# Patient Record
Sex: Female | Born: 1960 | Race: White | Hispanic: No | Marital: Single | Smoking: Former smoker
Health system: Southern US, Community
[De-identification: ages and names within clinical notes are randomized; demographics above are authoritative.]

## PROBLEM LIST (undated history)

## (undated) DIAGNOSIS — F418 Other specified anxiety disorders: Secondary | ICD-10-CM

## (undated) DIAGNOSIS — R011 Cardiac murmur, unspecified: Secondary | ICD-10-CM

## (undated) DIAGNOSIS — Z8616 Personal history of COVID-19: Secondary | ICD-10-CM

## (undated) DIAGNOSIS — R519 Headache, unspecified: Secondary | ICD-10-CM

## (undated) DIAGNOSIS — R51 Headache: Secondary | ICD-10-CM

## (undated) DIAGNOSIS — I10 Essential (primary) hypertension: Secondary | ICD-10-CM

## (undated) DIAGNOSIS — R7303 Prediabetes: Secondary | ICD-10-CM

## (undated) DIAGNOSIS — E785 Hyperlipidemia, unspecified: Secondary | ICD-10-CM

## (undated) DIAGNOSIS — Z8679 Personal history of other diseases of the circulatory system: Secondary | ICD-10-CM

## (undated) HISTORY — DX: Hyperlipidemia, unspecified: E78.5

## (undated) HISTORY — PX: TONSILLECTOMY: SUR1361

## (undated) HISTORY — PX: ANKLE FRACTURE SURGERY: SHX122

## (undated) HISTORY — DX: Other specified anxiety disorders: F41.8

## (undated) HISTORY — DX: Essential (primary) hypertension: I10

## (undated) HISTORY — DX: Cardiac murmur, unspecified: R01.1

## (undated) HISTORY — DX: Personal history of other diseases of the circulatory system: Z86.79

## (undated) HISTORY — DX: Headache, unspecified: R51.9

## (undated) HISTORY — DX: Prediabetes: R73.03

## (undated) HISTORY — DX: Personal history of COVID-19: Z86.16

## (undated) HISTORY — DX: Headache: R51

---

## 2016-09-17 DIAGNOSIS — Z23 Encounter for immunization: Secondary | ICD-10-CM | POA: Diagnosis not present

## 2017-01-11 DIAGNOSIS — Z6837 Body mass index (BMI) 37.0-37.9, adult: Secondary | ICD-10-CM | POA: Diagnosis not present

## 2017-01-11 DIAGNOSIS — I10 Essential (primary) hypertension: Secondary | ICD-10-CM | POA: Diagnosis not present

## 2017-01-11 DIAGNOSIS — F418 Other specified anxiety disorders: Secondary | ICD-10-CM | POA: Diagnosis not present

## 2017-02-07 DIAGNOSIS — Z0000 Encounter for general adult medical examination without abnormal findings: Secondary | ICD-10-CM | POA: Diagnosis not present

## 2017-02-09 DIAGNOSIS — Z0000 Encounter for general adult medical examination without abnormal findings: Secondary | ICD-10-CM | POA: Diagnosis not present

## 2017-02-09 DIAGNOSIS — Z124 Encounter for screening for malignant neoplasm of cervix: Secondary | ICD-10-CM | POA: Diagnosis not present

## 2017-02-09 DIAGNOSIS — R8271 Bacteriuria: Secondary | ICD-10-CM | POA: Diagnosis not present

## 2017-02-09 DIAGNOSIS — Z1231 Encounter for screening mammogram for malignant neoplasm of breast: Secondary | ICD-10-CM | POA: Diagnosis not present

## 2017-04-19 DIAGNOSIS — J209 Acute bronchitis, unspecified: Secondary | ICD-10-CM | POA: Diagnosis not present

## 2017-05-05 DIAGNOSIS — Z6835 Body mass index (BMI) 35.0-35.9, adult: Secondary | ICD-10-CM | POA: Diagnosis not present

## 2017-05-05 DIAGNOSIS — G4482 Headache associated with sexual activity: Secondary | ICD-10-CM | POA: Diagnosis not present

## 2017-05-09 DIAGNOSIS — Z6834 Body mass index (BMI) 34.0-34.9, adult: Secondary | ICD-10-CM | POA: Diagnosis not present

## 2017-05-09 DIAGNOSIS — G4482 Headache associated with sexual activity: Secondary | ICD-10-CM | POA: Diagnosis not present

## 2017-05-09 DIAGNOSIS — I10 Essential (primary) hypertension: Secondary | ICD-10-CM | POA: Diagnosis not present

## 2017-05-09 DIAGNOSIS — E669 Obesity, unspecified: Secondary | ICD-10-CM | POA: Diagnosis not present

## 2017-05-15 DIAGNOSIS — G4482 Headache associated with sexual activity: Secondary | ICD-10-CM | POA: Diagnosis not present

## 2017-05-15 DIAGNOSIS — R51 Headache: Secondary | ICD-10-CM | POA: Diagnosis not present

## 2017-05-23 ENCOUNTER — Encounter: Payer: Self-pay | Admitting: Neurology

## 2017-05-23 ENCOUNTER — Encounter (INDEPENDENT_AMBULATORY_CARE_PROVIDER_SITE_OTHER): Payer: Self-pay

## 2017-05-23 ENCOUNTER — Ambulatory Visit (INDEPENDENT_AMBULATORY_CARE_PROVIDER_SITE_OTHER): Payer: BLUE CROSS/BLUE SHIELD | Admitting: Neurology

## 2017-05-23 DIAGNOSIS — G43709 Chronic migraine without aura, not intractable, without status migrainosus: Secondary | ICD-10-CM | POA: Diagnosis not present

## 2017-05-23 DIAGNOSIS — IMO0002 Reserved for concepts with insufficient information to code with codable children: Secondary | ICD-10-CM | POA: Insufficient documentation

## 2017-05-23 DIAGNOSIS — H4922 Sixth [abducent] nerve palsy, left eye: Secondary | ICD-10-CM

## 2017-05-23 NOTE — Progress Notes (Signed)
PATIENT: Dana Hamilton DOB: [birth year]  Chief Complaint  Patient presents with  . Headache    Reports history of migraines that resolved after menopause.  However, she developed a severe headache on 05/05/17 during sexual activity.  Since this time, she has continue to have frequent headaches.  She estimates having three headaches per week.  She has been using Tylenol which has been helpful for her pain.  She would like to discuss the results of her recent MRI and MRA.  Marland Kitchen PCP    Bartholome Bill, MD     HISTORICAL  Dana Hamilton is a 56 years old left-handed female, seen in refer by  her primary care doctor Bartholome Bill for evaluation of headaches, MRI MRA report, initial evaluation was on May 23 2017.  She had a history of hypertension, depression anxiety, has been taking Wellbutrin 300 mg daily,  She had a history of migraine headache in her forties, improved after menopause, Maxalt as needed used to be very helpful, her typical migraine are lateralized severe pounding headache with associated light noise sensitivity, nauseous, lasting for a few hours,  She has not had migraine for few years, on May 06 2015, she had a sudden onset severe exploding headache during sex activity, lasting for 5 minutes, followed by low degree pressure headache next day, since then, she experienced feel more similar exertion related headache, such as bending over, sudden positional change, short lasting, no significant light noise sensitivity, more to her occipital region,  For that reason, she was referred for MRI of the brain, MRA of the brain in June 2018, we have personally reviewed the report in detail, normal MRI of the brain, there was description of mild irregularity at A2 segments bilaterally, there was no evidence of aneurysm   REVIEW OF SYSTEMS: Full 14 system review of systems performed and notable only for anxiety, not enough sleep, headache,   ALLERGIES: Not on File  HOME  MEDICATIONS: Current Outpatient Prescriptions  Medication Sig Dispense Refill  . buPROPion (WELLBUTRIN XL) 300 MG 24 hr tablet Take 300 mg by mouth daily.    . Cholecalciferol (VITAMIN D3) 2000 units capsule Take 2,000 Units by mouth daily.    Marland Kitchen lisinopril-hydrochlorothiazide (PRINZIDE,ZESTORETIC) 10-12.5 MG tablet Take 1 tablet by mouth daily.  2  . Multiple Vitamin (MULTIVITAMIN) capsule Take 1 capsule by mouth daily.     No current facility-administered medications for this visit.     PAST MEDICAL HISTORY: Past Medical History:  Diagnosis Date  . Depression with anxiety   . Headache   . Hypertension     PAST SURGICAL HISTORY: Past Surgical History:  Procedure Laterality Date  . ANKLE FRACTURE SURGERY Right   . TONSILLECTOMY      FAMILY HISTORY: Family History  Problem Relation Age of Onset  . Atrial fibrillation Mother   . Memory loss Mother   . Parkinson's disease Father   . Heart disease Maternal Grandfather   . Colon cancer Paternal Grandmother   . Transient ischemic attack Paternal Grandfather   . Stroke Paternal Uncle     SOCIAL HISTORY:  Social History   Social History  . Marital status: Single    Spouse name: N/A  . Number of children: 1  . Years of education: MBA   Occupational History  . Publicist    Social History Main Topics  . Smoking status: Former Smoker    Types: Cigarettes    Quit date: 11/01/1991  . Smokeless tobacco: Never  Used  . Alcohol use Yes     Comment: 1-2 glasses of wine per week  . Drug use: No     Comment: no use since 2010  . Sexual activity: Not on file   Other Topics Concern  . Not on file   Social History Narrative   Lives at home with her daughter.   Left-handed.   2 cups caffeine per day.     PHYSICAL EXAM   Vitals:   05/23/17 1448  BP: (!) 158/97  Pulse: 82  Weight: 223 lb (101.2 kg)  Height: 5\' 8"  (1.727 m)    Not recorded      Body mass index is 33.91 kg/m.  PHYSICAL EXAMNIATION:  Gen: NAD,  conversant, well nourised, obese, well groomed                     Cardiovascular: Regular rate rhythm, no peripheral edema, warm, nontender. Eyes: Conjunctivae clear without exudates or hemorrhage Neck: Supple, no carotid bruits. Pulmonary: Clear to auscultation bilaterally   NEUROLOGICAL EXAM:  MENTAL STATUS: Speech:    Speech is normal; fluent and spontaneous with normal comprehension.  Cognition:     Orientation to time, place and person     Normal recent and remote memory     Normal Attention span and concentration     Normal Language, naming, repeating,spontaneous speech     Fund of knowledge   CRANIAL NERVES: CN II: Visual fields are full to confrontation. Fundoscopic exam is normal with sharp discs and no vascular changes. Pupils are round equal and briskly reactive to light. CN III, IV, VI:  No ptosis. Left lateral gaze palsy CN V: Facial sensation is intact to pinprick in all 3 divisions bilaterally. Corneal responses are intact.  CN VII: Face is symmetric with normal eye closure and smile. CN VIII: Hearing is normal to rubbing fingers CN IX, X: Palate elevates symmetrically. Phonation is normal. CN XI: Head turning and shoulder shrug are intact CN XII: Tongue is midline with normal movements and no atrophy.  MOTOR: There is no pronator drift of out-stretched arms. Muscle bulk and tone are normal. Muscle strength is normal.  REFLEXES: Reflexes are 2+ and symmetric at the biceps, triceps, knees, and ankles. Plantar responses are flexor.  SENSORY: Intact to light touch, pinprick, positional sensation and vibratory sensation are intact in fingers and toes.  COORDINATION: Rapid alternating movements and fine finger movements are intact. There is no dysmetria on finger-to-nose and heel-knee-shin.    GAIT/STANCE: Posture is normal. Gait is steady with normal steps, base, arm swing, and turning. Heel and toe walking are normal. Tandem gait is normal.  Romberg is  absent.   DIAGNOSTIC DATA (LABS, IMAGING, TESTING) - I reviewed patient records, labs, notes, testing and imaging myself where available.   ASSESSMENT AND PLAN  Dana Hamilton is a 56 y.o. 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   Marcial Pacas, M.D. Ph.D.  [facility] [address] [place],  [address] Ph: [phone] Fax: [phone]  [id number]:OZYY, Dola Factor, MD

## 2017-05-24 DIAGNOSIS — H4920 Sixth [abducent] nerve palsy, unspecified eye: Secondary | ICD-10-CM | POA: Insufficient documentation

## 2017-06-09 ENCOUNTER — Ambulatory Visit: Payer: BLUE CROSS/BLUE SHIELD | Admitting: Diagnostic Neuroimaging

## 2017-08-03 ENCOUNTER — Observation Stay (HOSPITAL_COMMUNITY)
Admission: EM | Admit: 2017-08-03 | Discharge: 2017-08-04 | Disposition: A | Discharge status: Home / Self Care | Payer: BLUE CROSS/BLUE SHIELD | Attending: Internal Medicine | Admitting: Internal Medicine

## 2017-08-03 ENCOUNTER — Emergency Department (HOSPITAL_COMMUNITY): Payer: BLUE CROSS/BLUE SHIELD

## 2017-08-03 ENCOUNTER — Encounter (HOSPITAL_COMMUNITY): Payer: Self-pay | Admitting: Emergency Medicine

## 2017-08-03 DIAGNOSIS — Z87891 Personal history of nicotine dependence: Secondary | ICD-10-CM | POA: Diagnosis not present

## 2017-08-03 DIAGNOSIS — R079 Chest pain, unspecified: Secondary | ICD-10-CM | POA: Diagnosis present

## 2017-08-03 DIAGNOSIS — R000 Tachycardia, unspecified: Secondary | ICD-10-CM | POA: Diagnosis not present

## 2017-08-03 DIAGNOSIS — R0789 Other chest pain: Secondary | ICD-10-CM | POA: Insufficient documentation

## 2017-08-03 DIAGNOSIS — I700 Atherosclerosis of aorta: Principal | ICD-10-CM | POA: Insufficient documentation

## 2017-08-03 DIAGNOSIS — R9431 Abnormal electrocardiogram [ECG] [EKG]: Secondary | ICD-10-CM | POA: Diagnosis not present

## 2017-08-03 DIAGNOSIS — D72829 Elevated white blood cell count, unspecified: Secondary | ICD-10-CM | POA: Diagnosis not present

## 2017-08-03 DIAGNOSIS — R739 Hyperglycemia, unspecified: Secondary | ICD-10-CM

## 2017-08-03 DIAGNOSIS — Z79899 Other long term (current) drug therapy: Secondary | ICD-10-CM | POA: Diagnosis not present

## 2017-08-03 DIAGNOSIS — R7989 Other specified abnormal findings of blood chemistry: Secondary | ICD-10-CM | POA: Diagnosis not present

## 2017-08-03 DIAGNOSIS — R072 Precordial pain: Secondary | ICD-10-CM

## 2017-08-03 DIAGNOSIS — I10 Essential (primary) hypertension: Secondary | ICD-10-CM | POA: Insufficient documentation

## 2017-08-03 LAB — CBC
HCT: 42.8 % (ref 36.0–46.0)
Hemoglobin: 14.2 g/dL (ref 12.0–15.0)
MCH: 27.9 pg (ref 26.0–34.0)
MCHC: 33.2 g/dL (ref 30.0–36.0)
MCV: 84.1 fL (ref 78.0–100.0)
PLATELETS: 316 10*3/uL (ref 150–400)
RBC: 5.09 MIL/uL (ref 3.87–5.11)
RDW: 13.9 % (ref 11.5–15.5)
WBC: 18 10*3/uL — AB (ref 4.0–10.5)

## 2017-08-03 LAB — BASIC METABOLIC PANEL
Anion gap: 9 (ref 5–15)
BUN: 19 mg/dL (ref 6–20)
CO2: 26 mmol/L (ref 22–32)
CREATININE: 1.17 mg/dL — AB (ref 0.44–1.00)
Calcium: 9.3 mg/dL (ref 8.9–10.3)
Chloride: 100 mmol/L — ABNORMAL LOW (ref 101–111)
GFR calc Af Amer: 58 mL/min — ABNORMAL LOW (ref >60)
GFR, EST NON AFRICAN AMERICAN: 50 mL/min — AB (ref >60)
GLUCOSE: 109 mg/dL — AB (ref 65–99)
Potassium: 4.5 mmol/L (ref 3.5–5.1)
SODIUM: 135 mmol/L (ref 135–145)

## 2017-08-03 LAB — HEMOGLOBIN A1C
HEMOGLOBIN A1C: 5.5 % (ref 4.8–5.6)
MEAN PLASMA GLUCOSE: 111.15 mg/dL

## 2017-08-03 LAB — I-STAT TROPONIN, ED: Troponin i, poc: 0.01 ng/mL (ref 0.00–0.08)

## 2017-08-03 LAB — TROPONIN I: Troponin I: <0.03 ng/mL (ref <0.03)

## 2017-08-03 LAB — TSH: TSH: 0.62 u[IU]/mL (ref 0.350–4.500)

## 2017-08-03 LAB — D-DIMER, QUANTITATIVE (NOT AT ARMC): D DIMER QUANT: 0.8 ug{FEU}/mL — AB (ref 0.00–0.50)

## 2017-08-03 MED ORDER — CARVEDILOL 6.25 MG PO TABS
6.2500 mg | ORAL_TABLET | Freq: Two times a day (BID) | ORAL | Status: DC
  Filled 2017-08-03 (×2): qty 1

## 2017-08-03 MED ORDER — ACETAMINOPHEN 325 MG PO TABS: 650.0000 mg | ORAL_TABLET | Freq: Four times a day (QID) | ORAL | Status: DC | PRN

## 2017-08-03 MED ORDER — ENOXAPARIN SODIUM 40 MG/0.4ML ~~LOC~~ SOLN
40.0000 mg | Freq: Every day | SUBCUTANEOUS | Status: DC
  Administered 2017-08-03: 40 mg via SUBCUTANEOUS
  Filled 2017-08-03: qty 0.4

## 2017-08-03 MED ORDER — ACETAMINOPHEN 650 MG RE SUPP: 650.0000 mg | Freq: Four times a day (QID) | RECTAL | Status: DC | PRN

## 2017-08-03 MED ORDER — KETOROLAC TROMETHAMINE 30 MG/ML IJ SOLN
30.0000 mg | Freq: Once | INTRAMUSCULAR | Status: AC
  Administered 2017-08-03: 30 mg via INTRAVENOUS
  Filled 2017-08-03: qty 1

## 2017-08-03 MED ORDER — ATORVASTATIN CALCIUM 80 MG PO TABS: 80.0000 mg | ORAL_TABLET | Freq: Every day | ORAL | Status: DC

## 2017-08-03 MED ORDER — IOPAMIDOL (ISOVUE-370) INJECTION 76%
INTRAVENOUS | Status: AC
  Administered 2017-08-03: 100 mL
  Filled 2017-08-03: qty 100

## 2017-08-03 MED ORDER — ASPIRIN EC 81 MG PO TBEC
81.0000 mg | DELAYED_RELEASE_TABLET | Freq: Every day | ORAL | Status: DC
  Administered 2017-08-03 – 2017-08-04 (×2): 81 mg via ORAL
  Filled 2017-08-03 (×2): qty 1

## 2017-08-03 MED ORDER — BUPROPION HCL ER (XL) 150 MG PO TB24
300.0000 mg | ORAL_TABLET | Freq: Every day | ORAL | Status: DC
  Administered 2017-08-03 – 2017-08-04 (×2): 300 mg via ORAL
  Filled 2017-08-03 (×2): qty 2

## 2017-08-03 NOTE — ED Notes (Signed)
Pt placed on hospital bed at this time.  

## 2017-08-03 NOTE — ED Notes (Signed)
ED Provider at bedside. 

## 2017-08-03 NOTE — ED Notes (Signed)
Cardiology MD at bedside.

## 2017-08-03 NOTE — ED Notes (Signed)
Pt called multiple times without response

## 2017-08-03 NOTE — ED Notes (Signed)
Pt up to restroom with steady gait, denies dizziness or lightheadedness

## 2017-08-03 NOTE — ED Notes (Signed)
Cardiologist at bedside.  

## 2017-08-03 NOTE — H&P (Signed)
TRH H&P   Patient Demographics:    Dana Hamilton, is a 56 y.o. female  MRN: [id number]   DOB - [birth year]  Admit Date - 08/03/2017  Outpatient Primary MD for the patient is Bartholome Bill, MD  Referring MD/NP/PA:   Farley Ly  Outpatient Specialists:  Bonnielee Haff (neurology)  Patient coming from: home=> urgent care=> pcp  Chief Complaint  Patient presents with  . Chest Pain      HPI:    Dana Hamilton  is a 56 y.o. female, w hypertension, apparently presents with c/o chest pain 11:30 am  At rest while working on the computer, substernal, 6/10 , moderate , heavy and pressure like.  Radiation to the left shoulder and right shoulder and left arm. Pt went to urgent care to be evaluated, and then went to see pcp and sent to ED.  Pt was given slg, nitro without relief.  .   In ED, trop negative.  Wbc 18.0, Hgb 14.2, Plt 316, Bun 19, Creatinine 1.17,  Glucose 109,  Ekg, see below,  ? St elevation in 2,3.  Pt was seen by cardiology and no STEMI CTA chest => negative for PE, no pneumonia, mild coronary artery calcifications. Pt is  1/10 chest pain at this time.    Pt will be admitted for w/up of chest pain .     Review of systems:    In addition to the HPI above,  T. Chol 185, Tg 100, HDl 42, LDL 123 on 02/07/2017  No Fever-chills, No Headache, No changes with Vision or hearing, No problems swallowing food or Liquids, Cough or Shortness of Breath, No Abdominal pain, No Nausea or Vommitting, Bowel movements are regular, No Blood in stool or Urine, No dysuria, No new skin rashes or bruises, No new joints pains-aches,  No new weakness, tingling, numbness in any extremity, No recent weight gain or loss, No polyuria, polydypsia or polyphagia, No significant Mental Stressors.  A full 10 point Review of Systems was done, except as stated above, all other Review of  Systems were negative.   With Past History of the following :    Past Medical History:  Diagnosis Date  . Depression with anxiety   . Headache   . Hypertension       Past Surgical History:  Procedure Laterality Date  . ANKLE FRACTURE SURGERY Right   . TONSILLECTOMY        Social History:     Social History  Substance Use Topics  . Smoking status: Former Smoker    Types: Cigarettes    Quit date: 11/01/1991  . Smokeless tobacco: Never Used  . Alcohol use Yes     Comment: 1-2 glasses of wine per week     Lives - at home  Mobility - walks by self   Family History :     Family History  Problem Relation Age of Onset  . Atrial fibrillation Mother   . Memory loss Mother   . Parkinson's disease Father   . Heart disease Maternal Grandfather   . Colon cancer Paternal Grandmother   . Transient ischemic attack Paternal Grandfather   . Stroke Paternal Uncle      Home Medications:   Prior to Admission medications   Medication Sig Start Date End Date Taking? Authorizing Provider  acetaminophen (TYLENOL) 325 MG tablet Take 650 mg by mouth every 6 (six) hours as needed (for pain or headaches).    Yes [provider]  buPROPion (WELLBUTRIN XL) 300 MG 24 hr tablet Take 300 mg by mouth daily. 01/11/17  Yes [provider]  Cholecalciferol (VITAMIN D3) 2000 units capsule Take 2,000 Units by mouth daily.   Yes [provider]  ibuprofen (ADVIL,MOTRIN) 200 MG tablet Take 400 mg by mouth every 6 (six) hours as needed (for pain or headaches).   Yes [provider]  lisinopril-hydrochlorothiazide (PRINZIDE,ZESTORETIC) 10-12.5 MG tablet Take 1 tablet by mouth daily. 04/08/17  Yes [provider]  Multiple Vitamins-Calcium (ONE-A-DAY WOMENS FORMULA PO) Take 1 tablet by mouth daily.   Yes [provider]     Allergies:    No Known Allergies   Physical Exam:   Vitals  Blood pressure 129/90, pulse 100, temperature 99.2 F (37.3  C), temperature source Oral, resp. rate 17, height 5\' 6"  (1.676 m), weight 99.8 kg (220 lb), SpO2 100 %.   1. General  lying in bed in NAD,    2. Normal affect and insight, Not Suicidal or Homicidal, Awake Alert, Oriented X 3.  3. No F.N deficits, ALL C.Nerves Intact, Strength 5/5 all 4 extremities, Sensation intact all 4 extremities, Plantars down going.  4. Ears and Eyes appear Normal, Conjunctivae clear, PERRLA. Moist Oral Mucosa.  5. Supple Neck, No JVD, No cervical lymphadenopathy appriciated, No Carotid Bruits.  6. Symmetrical Chest wall movement, Good air movement bilaterally, CTAB.  7. RRR, No Gallops, Rubs or Murmurs, No Parasternal Heave.  8. Positive Bowel Sounds, Abdomen Soft, No tenderness, No organomegaly appriciated,No rebound -guarding or rigidity.  9.  No Cyanosis, Normal Skin Turgor, No Skin Rash or Bruise.  10. Good muscle tone,  joints appear normal , no effusions, Normal ROM.  11. No Palpable Lymph Nodes in Neck or Axillae     Data Review:    CBC  Recent Labs Lab 08/03/17 1612  WBC 18.0*  HGB 14.2  HCT 42.8  PLT 316  MCV 84.1  MCH 27.9  MCHC 33.2  RDW 13.9   ------------------------------------------------------------------------------------------------------------------  Chemistries   Recent Labs Lab 08/03/17 1612  NA 135  K 4.5  CL 100*  CO2 26  GLUCOSE 109*  BUN 19  CREATININE 1.17*  CALCIUM 9.3   ------------------------------------------------------------------------------------------------------------------ estimated creatinine clearance is 62.5 mL/min (A) (by C-G formula based on SCr of 1.17 mg/dL (H)). ------------------------------------------------------------------------------------------------------------------ No results for input(s): TSH, T4TOTAL, T3FREE, THYROIDAB in the last 72 hours.  Invalid input(s): FREET3  Coagulation profile No results for input(s): INR, PROTIME in the last 168  hours. -------------------------------------------------------------------------------------------------------------------  Recent Labs  08/03/17 1612  DDIMER 0.80*   -------------------------------------------------------------------------------------------------------------------  Cardiac Enzymes No results for input(s): CKMB, TROPONINI, MYOGLOBIN in the last 168 hours.  Invalid input(s): CK ------------------------------------------------------------------------------------------------------------------ No results found for: BNP   ---------------------------------------------------------------------------------------------------------------  Urinalysis No results found for: COLORURINE, APPEARANCEUR, [place], [place], GLUCOSEU, HGBUR, BILIRUBINUR, KETONESUR, PROTEINUR, UROBILINOGEN, NITRITE, LEUKOCYTESUR  ----------------------------------------------------------------------------------------------------------------   Imaging Results:  Dg Chest 2 View  Result Date: 08/03/2017 CLINICAL DATA:  Mid chest pain. Abnormal EKG no respiratory distress. History of hypertension, former smoker. EXAM: CHEST  2 VIEW COMPARISON:  None in PACs FINDINGS: The lungs are borderline hypoinflated but clear. The heart and pulmonary vascularity are normal. The mediastinum is normal in width. There is no pleural effusion. The bony thorax exhibits no acute abnormality. IMPRESSION: There is no CHF nor other acute cardiopulmonary abnormality. Electronically Signed   By: David  Martinique M.D.   On: 08/03/2017 16:54   Ct Angio Chest Pe W And/or Wo Contrast  Result Date: 08/03/2017 CLINICAL DATA:  Chest pain shortness of breath.  Positive D-dimer. EXAM: CT ANGIOGRAPHY CHEST WITH CONTRAST TECHNIQUE: Multidetector CT imaging of the chest was performed using the standard protocol during bolus administration of intravenous contrast. Multiplanar CT image reconstructions and MIPs were obtained to evaluate the vascular  anatomy. CONTRAST:  100 mL of Isovue 370 intravenous contrast COMPARISON:  Current chest radiograph. FINDINGS: Cardiovascular: Satisfactory opacification of the pulmonary arteries to the segmental level. No evidence of pulmonary embolism. Heart is normal in size. No pericardial effusion. Mild coronary artery calcifications. Great vessels are normal in caliber. No aortic dissection. No atherosclerosis. Mediastinum/Nodes: No enlarged mediastinal, hilar, or axillary lymph nodes. Thyroid gland, trachea, and esophagus demonstrate no significant findings. Lungs/Pleura: No focal lung consolidation. Mild areas of relative increased decreased attenuation consistent mosaic profusion. This is likely on the basis of small airways disease. No pulmonary edema. No lung mass or suspicious nodule. No pleural effusion or pneumothorax. Upper Abdomen: No acute abnormality. Musculoskeletal: No fracture or acute finding. No osteoblastic or osteolytic lesions. Mild disc degenerative changes noted throughout the thoracic spine Review of the MIP images confirms the above findings. IMPRESSION: 1. No evidence of a pulmonary embolism. 2. Mild mosaic profusion evident in the lungs most likely due to small airways disease. 3. No evidence of pneumonia.  No pulmonary edema. Electronically Signed   By: Lajean Manes M.D.   On: 08/03/2017 19:02   Nsr at 80 nl axis, nl int, early R progression, ? Hint of ST elevation in 2   Assessment & Plan:    Principal Problem:   Chest pain Active Problems:   Leukocytosis   Tachycardia   Hyperglycemia    Cp Tele Trop I q3h x3 Aspirin, lipitor NPO after Mn Cardiac echo Cardiology consulted, appreciate input.  Defer to cardiology regarding stress testing.   Tachycardia, ? Anxiety,  Check tsh Check cardiac echo as above  Leukocytosis ? Stress Repeat cbc in am  Hyperglycemia Check hga1c,   Mild renal insufficiency Check cmp in am   DVT Prophylaxis   Lovenox - SCDs  AM Labs  Ordered, also please review Full Orders  Family Communication: Admission, patients condition and plan of care including tests being ordered have been discussed with the patient  who indicate understanding and agree with the plan and Code Status.  Code Status  FULL CODE  Likely DC to  home  Condition GUARDED    Consults called:   Cardiology   Admission status: obs   Time spent in minutes : 45    Jani Gravel M.D on 08/03/2017 at 8:01 PM  Between 7am to 7pm - Pager - [phone]. After 7pm go to [email or web address] - password [facility]  [facility] - Office  [phone]

## 2017-08-03 NOTE — ED Provider Notes (Signed)
[facility] DEPT Provider Note   CSN: [id number] Arrival date & time: 08/03/17  1537     History   Chief Complaint Chief Complaint  Patient presents with  . Chest Pain    HPI Dana Hamilton is a 56 y.o. female.  The history is provided by the patient and medical records. No language interpreter was used.  Chest Pain   This is a new problem. The current episode started 6 to 12 hours ago. The problem occurs constantly. The problem has not changed since onset.The pain is present in the substernal region. The pain is at a severity of 6/10. The pain is moderate. The quality of the pain is described as heavy, pressure-like and pleuritic. The pain radiates to the left shoulder, right shoulder and left arm. Duration of episode(s) is 5 hours. Associated symptoms include diaphoresis, nausea and shortness of breath. Pertinent negatives include no abdominal pain, no back pain, no cough, no fever, no headaches, no irregular heartbeat, no lower extremity edema, no malaise/fatigue, no numbness, no palpitations, no sputum production, no syncope, no vomiting and no weakness. She has tried nitroglycerin for the symptoms. The treatment provided moderate relief. Risk factors include stress.  Her past medical history is significant for hypertension.    Past Medical History:  Diagnosis Date  . Depression with anxiety   . Headache   . Hypertension     Patient Active Problem List   Diagnosis Date Noted  . 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 Chronic migraine 05/23/2017    Past Surgical History:  Procedure Laterality Date  . ANKLE FRACTURE SURGERY Right   . TONSILLECTOMY      OB History    No data available       Home Medications    Prior to Admission medications   Medication Sig Start Date End Date Taking? Authorizing Provider  buPROPion (WELLBUTRIN XL) 300 MG 24 hr tablet Take 300 mg by mouth daily. 01/11/17   [provider]  Cholecalciferol (VITAMIN D3) 2000  units capsule Take 2,000 Units by mouth daily.    [provider]  lisinopril-hydrochlorothiazide (PRINZIDE,ZESTORETIC) 10-12.5 MG tablet Take 1 tablet by mouth daily. 04/08/17   [provider]    Family History Family History  Problem Relation Age of Onset  . Atrial fibrillation Mother   . Memory loss Mother   . Parkinson's disease Father   . Heart disease Maternal Grandfather   . Colon cancer Paternal Grandmother   . Transient ischemic attack Paternal Grandfather   . Stroke Paternal Uncle     Social History Social History  Substance Use Topics  . Smoking status: Former Smoker    Types: Cigarettes    Quit date: 11/01/1991  . Smokeless tobacco: Never Used  . Alcohol use Yes     Comment: 1-2 glasses of wine per week     Allergies   Patient has no known allergies.   Review of Systems Review of Systems  Constitutional: Positive for diaphoresis. Negative for chills, fatigue, fever and malaise/fatigue.  HENT: Negative for congestion.   Eyes: Negative for visual disturbance.  Respiratory: Positive for shortness of breath. Negative for cough, sputum production, choking, chest tightness, wheezing and stridor.   Cardiovascular: Positive for chest pain. Negative for palpitations, leg swelling and syncope.  Gastrointestinal: Positive for nausea. Negative for abdominal pain, constipation, diarrhea and vomiting.  Genitourinary: Negative for dysuria.  Musculoskeletal: Negative for back pain, neck pain and neck stiffness.  Skin: Negative for rash and wound.  Neurological:  Negative for weakness, light-headedness, numbness and headaches.  Psychiatric/Behavioral: Negative for agitation and confusion.  All other systems reviewed and are negative.    Physical Exam Updated Vital Signs BP (!) 149/119 (BP Location: Left Arm)   Pulse 98   Temp 99.2 F (37.3 C) (Oral)   Resp (!) 23   Ht 5\' 6"  (1.676 m)   Wt 99.8 kg (220 lb)   SpO2 96%   BMI 35.51 kg/m   Physical  Exam  Constitutional: She is oriented to person, place, and time. She appears well-developed and well-nourished. No distress.  HENT:  Head: Normocephalic and atraumatic.  Mouth/Throat: Oropharynx is clear and moist. No oropharyngeal exudate.  Eyes: Pupils are equal, round, and reactive to light. Conjunctivae and EOM are normal.  Neck: Normal range of motion. Neck supple.  Cardiovascular: Normal rate and intact distal pulses.   No murmur heard. Pulmonary/Chest: Effort normal and breath sounds normal. No stridor. No respiratory distress. She has no wheezes. She has no rales. She exhibits no tenderness.  Abdominal: Soft. There is no tenderness.  Musculoskeletal: She exhibits no edema or tenderness.  Neurological: She is alert and oriented to person, place, and time. No sensory deficit. She exhibits normal muscle tone.  Skin: Skin is warm and dry. Capillary refill takes less than 2 seconds. No rash noted. She is not diaphoretic. No erythema.  Psychiatric: She has a normal mood and affect.  Nursing note and vitals reviewed.    ED Treatments / Results  Labs (all labs ordered are listed, but only abnormal results are displayed) Labs Reviewed  BASIC METABOLIC PANEL - Abnormal; Notable for the following:       Result Value   Chloride 100 (*)    Glucose, Bld 109 (*)    Creatinine, Ser 1.17 (*)    GFR calc non Af Amer 50 (*)    GFR calc Af Amer 58 (*)    All other components within normal limits  CBC - Abnormal; Notable for the following:    WBC 18.0 (*)    All other components within normal limits  D-DIMER, QUANTITATIVE (NOT AT [facility]) - Abnormal; Notable for the following:    D-Dimer, Quant 0.80 (*)    All other components within normal limits  COMPREHENSIVE METABOLIC PANEL  CBC  TROPONIN I  TROPONIN I  TROPONIN I  TSH  HEMOGLOBIN A1C  I-STAT TROPONIN, ED    EKG  EKG Interpretation  Date/Time:  Thursday August 03 2017 16:01:52 EDT Ventricular Rate:  101 PR Interval:    QRS  Duration: 126 QT Interval:  362 QTC Calculation: 470 R Axis:   101 Text Interpretation:  Sinus tachycardia Nonspecific intraventricular conduction delay Minimal ST elevation, inferior leads No prior ECG for comparison. No STEMI Confirmed by Antony Blackbird [phone]) on 08/03/2017 4:12:06 PM       Radiology Dg Chest 2 View  Result Date: 08/03/2017 CLINICAL DATA:  Mid chest pain. Abnormal EKG no respiratory distress. History of hypertension, former smoker. EXAM: CHEST  2 VIEW COMPARISON:  None in PACs FINDINGS: The lungs are borderline hypoinflated but clear. The heart and pulmonary vascularity are normal. The mediastinum is normal in width. There is no pleural effusion. The bony thorax exhibits no acute abnormality. IMPRESSION: There is no CHF nor other acute cardiopulmonary abnormality. Electronically Signed   By: David  Martinique M.D.   On: 08/03/2017 16:54   Ct Angio Chest Pe W And/or Wo Contrast  Result Date: 08/03/2017 CLINICAL DATA:  Chest pain shortness  of breath.  Positive D-dimer. EXAM: CT ANGIOGRAPHY CHEST WITH CONTRAST TECHNIQUE: Multidetector CT imaging of the chest was performed using the standard protocol during bolus administration of intravenous contrast. Multiplanar CT image reconstructions and MIPs were obtained to evaluate the vascular anatomy. CONTRAST:  100 mL of Isovue 370 intravenous contrast COMPARISON:  Current chest radiograph. FINDINGS: Cardiovascular: Satisfactory opacification of the pulmonary arteries to the segmental level. No evidence of pulmonary embolism. Heart is normal in size. No pericardial effusion. Mild coronary artery calcifications. Great vessels are normal in caliber. No aortic dissection. No atherosclerosis. Mediastinum/Nodes: No enlarged mediastinal, hilar, or axillary lymph nodes. Thyroid gland, trachea, and esophagus demonstrate no significant findings. Lungs/Pleura: No focal lung consolidation. Mild areas of relative increased decreased attenuation consistent  mosaic profusion. This is likely on the basis of small airways disease. No pulmonary edema. No lung mass or suspicious nodule. No pleural effusion or pneumothorax. Upper Abdomen: No acute abnormality. Musculoskeletal: No fracture or acute finding. No osteoblastic or osteolytic lesions. Mild disc degenerative changes noted throughout the thoracic spine Review of the MIP images confirms the above findings. IMPRESSION: 1. No evidence of a pulmonary embolism. 2. Mild mosaic profusion evident in the lungs most likely due to small airways disease. 3. No evidence of pneumonia.  No pulmonary edema. Electronically Signed   By: Lajean Manes M.D.   On: 08/03/2017 19:02    Procedures Procedures (including critical care time)  Medications Ordered in ED Medications  buPROPion (WELLBUTRIN XL) 24 hr tablet 300 mg (300 mg Oral Given 08/03/17 2131)  enoxaparin (LOVENOX) injection 40 mg (not administered)  acetaminophen (TYLENOL) tablet 650 mg (not administered)    Or  acetaminophen (TYLENOL) suppository 650 mg (not administered)  aspirin EC tablet 81 mg (81 mg Oral Given 08/03/17 2130)  atorvastatin (LIPITOR) tablet 80 mg (not administered)  carvedilol (COREG) tablet 6.25 mg (6.25 mg Oral Not Given 08/03/17 2135)  iopamidol (ISOVUE-370) 76 % injection (100 mLs  Contrast Given 08/03/17 1816)  ketorolac (TORADOL) 30 MG/ML injection 30 mg (30 mg Intravenous Given 08/03/17 2036)     Initial Impression / Assessment and Plan / ED Course  I have reviewed the triage vital signs and the nursing notes.  Pertinent labs & imaging results that were available during my care of the patient were reviewed by me and considered in my medical decision making (see chart for details).     Dana Hamilton is a 56 y.o. female with a past medical history significant for depression and hypertension who presents for chest pain. Patient said that this morning while at work around 11 AM sitting at her desk, she experienced sudden onset of  severe chest pressure. She describes as a weight on her chest. She says the pain was a 5 out of 10 in severity and radiated into her shoulders. She reports left arm aching and tightness. She describes the pain having associated nausea, mild diaphoresis, and some shortness of breath. She described the pain is intermittently pleuritic but denied exertional symptoms. She has no history of heart disease and no history of DVTs or PE. No recent long trips. No leg pain or leg swelling. She denies any lightheadedness or syncope. She reports that she went to urgent care who then sent her to her PCP. At the PCP office. Patient had EKG showing concern for ST elevation in leads 2, 3, and aVF. Patient was given aspirin and nitroglycerin. Patient reports the nitroglycerin improved her pain. It is now 1 out of 10 in severity.  She denies any recent medication changes. She does report that she started kickboxing several weeks ago and did not have exertional symptoms during that period  She denies fevers, chills, congestion, or cough. His urinary symptoms or GI symptoms.  On exam, pain was not reproducible. Patient's lungs are clear. Pulses are symmetric in upper and lower cavities. No significant lower extremity edema. No abdominal tenderness. No back tenderness. No focal neurologic deficits.   EKG shot on arrival showed minimal ST elevations but no STEMI. No prior EKGs present for review.  Patient will have workup to look for etiology of symptoms.  Heart score calculated as a 5. Anticipate speaking with cardiology after diagnostic testing returns.   Initial phone and was negative. D-dimer positive, CT PE study ordered. No evidence of pneumonia, edema, or pulmonary embolism on imaging. Leukocytosis was present. No anemia.  Cardiology was called given the elevated heart score of 5. They agreed with patient being admitted to hospitalist service and they will follow for further management.   Patient admitted to  hospitalist service.   Final Clinical Impressions(s) / ED Diagnoses   Final diagnoses:  Precordial chest pain     Clinical Impression: 1. Precordial chest pain     Disposition: Admit to Hospitalist with cardiology following    Tegeler, Gwenyth Allegra, MD 08/03/17 [phone]

## 2017-08-03 NOTE — Consult Note (Signed)
Primary cardiologist: n/a Consulting cardiologist:Dr Carlyle Dolly MD Requesting physician: Dr Sherry Ruffing Indication: chest pain  Clinical Summary Ms. Lahm is a 56 y.o.female no known cardiac history presents with chest pain. Pain started at 1130AM while sitting at compute. Dull/pressure midchest 5/10 with no other associated symptoms. Worst with position and deep breathing. Pain constant and still ongoing (now at 8 hrs), though more mild than previous. No prior history of chest pain. Remains very active, trains regularly in kickboxing classes.     K 4.5, Cr 1.17, WBC 18, Hgb 14.2, Plt 316, D-dimer +, trop neg x 1 CXR no acute process EKG SR, RBBB, nonspecific ST/T changes CT PE: no PE   No Known Allergies  Medications Scheduled Medications:    Infusions:    PRN Medications:     Past Medical History:  Diagnosis Date  . Depression with anxiety   . Headache   . Hypertension     Past Surgical History:  Procedure Laterality Date  . ANKLE FRACTURE SURGERY Right   . TONSILLECTOMY      Family History  Problem Relation Age of Onset  . Atrial fibrillation Mother   . Memory loss Mother   . Parkinson's disease Father   . Heart disease Maternal Grandfather   . Colon cancer Paternal Grandmother   . Transient ischemic attack Paternal Grandfather   . Stroke Paternal Uncle     Social History Ms. Milanes reports that she quit smoking about 25 years ago. Her smoking use included Cigarettes. She has never used smokeless tobacco. Ms. Knapik reports that she drinks alcohol.  Review of Systems CONSTITUTIONAL: No weight loss, fever, chills, weakness or fatigue.  HEENT: Eyes: No visual loss, blurred vision, double vision or yellow sclerae. No hearing loss, sneezing, congestion, runny nose or sore throat.  SKIN: No rash or itching.  CARDIOVASCULAR: per hpi RESPIRATORY: per hpi GASTROINTESTINAL: No anorexia, nausea, vomiting or diarrhea. No abdominal pain or blood.    GENITOURINARY: no polyuria, no dysuria NEUROLOGICAL: No headache, dizziness, syncope, paralysis, ataxia, numbness or tingling in the extremities. No change in bowel or bladder control.  MUSCULOSKELETAL: No muscle, back pain, joint pain or stiffness.  HEMATOLOGIC: No anemia, bleeding or bruising.  LYMPHATICS: No enlarged nodes. No history of splenectomy.  PSYCHIATRIC: No history of depression or anxiety.      Physical Examination Blood pressure 117/89, pulse 99, temperature 99.2 F (37.3 C), temperature source Oral, resp. rate (!) 23, height 5\' 6"  (1.676 m), weight 220 lb (99.8 kg), SpO2 97 %. No intake or output data in the 24 hours ending 08/03/17 1936  HEENT: sclera clear, throat clear  Cardiovascular: RRR, no m/r/g, no jvd  Respiratory: CTAB  GI: abdomen soft, NT, ND  MSK: no Le edema  Neuro:no focal deficits  Psych: appropriate affect   Lab Results  Basic Metabolic Panel:  Recent Labs Lab 08/03/17 1612  NA 135  K 4.5  CL 100*  CO2 26  GLUCOSE 109*  BUN 19  CREATININE 1.17*  CALCIUM 9.3    Liver Function Tests: No results for input(s): AST, ALT, ALKPHOS, BILITOT, PROT, ALBUMIN in the last 168 hours.  CBC:  Recent Labs Lab 08/03/17 1612  WBC 18.0*  HGB 14.2  HCT 42.8  MCV 84.1  PLT 316    Cardiac Enzymes: No results for input(s): CKTOTAL, CKMB, CKMBINDEX, TROPONINI in the last 168 hours.  BNP: Invalid input(s): POCBNP   ECG   Imaging   Impression/Recommendations 1. Chest pain - atypical chest  pain. Ongoing constant x 8 hours as of now, worst with position and deep breathing. Minimal CAD risk factors.  - EKG SR, no specific ischemic changes. I have reviewed outside EKGs and not consistent with AMI. Trops neg so far - monitor overnight, cycle enzymes and EKG. Order echo for AM. If negative workup would plan for discharge and outpatient follow up to reevalute for need for stress. At this time I do not see clear indication for stress  test, but please make npo at midnight in case becomes indicated.  - trial of IV toradiol in ER for pain.   Carlyle Dolly, M.D.

## 2017-08-03 NOTE — ED Notes (Signed)
Dinner tray arrives at bedside

## 2017-08-03 NOTE — ED Triage Notes (Signed)
Per EMS,  Patient is from [place] office complaining of central chest pain that radiates to right and left chest.  Went to UC and referred to PCP and PCP referred to [facility] ED.  EKG showed very mild ST elevation.  4-10 pain.  Given 324 aspirin and 0.4 nitro in route.  No pain difference after nitro or aspirin.  No apparent distress.  Rosita  Hx of depression and hypertension.  105 HR, RR 18, 98% RA, 136/94.

## 2017-08-04 ENCOUNTER — Observation Stay (HOSPITAL_COMMUNITY): Payer: BLUE CROSS/BLUE SHIELD

## 2017-08-04 DIAGNOSIS — R072 Precordial pain: Secondary | ICD-10-CM

## 2017-08-04 DIAGNOSIS — R079 Chest pain, unspecified: Secondary | ICD-10-CM | POA: Diagnosis not present

## 2017-08-04 DIAGNOSIS — R739 Hyperglycemia, unspecified: Secondary | ICD-10-CM | POA: Diagnosis not present

## 2017-08-04 DIAGNOSIS — D72829 Elevated white blood cell count, unspecified: Secondary | ICD-10-CM | POA: Diagnosis not present

## 2017-08-04 DIAGNOSIS — R000 Tachycardia, unspecified: Secondary | ICD-10-CM | POA: Diagnosis not present

## 2017-08-04 LAB — LIPID PANEL
Cholesterol: 145 mg/dL (ref 0–200)
HDL: 48 mg/dL (ref >40)
LDL Cholesterol: 89 mg/dL (ref 0–99)
Total CHOL/HDL Ratio: 3 RATIO
Triglycerides: 42 mg/dL (ref <150)
VLDL: 8 mg/dL (ref 0–40)

## 2017-08-04 LAB — COMPREHENSIVE METABOLIC PANEL
ALT: 16 U/L (ref 14–54)
AST: 15 U/L (ref 15–41)
Albumin: 3.5 g/dL (ref 3.5–5.0)
Alkaline Phosphatase: 58 U/L (ref 38–126)
Anion gap: 9 (ref 5–15)
BILIRUBIN TOTAL: 0.8 mg/dL (ref 0.3–1.2)
BUN: 20 mg/dL (ref 6–20)
CO2: 27 mmol/L (ref 22–32)
CREATININE: 1.21 mg/dL — AB (ref 0.44–1.00)
Calcium: 8.9 mg/dL (ref 8.9–10.3)
Chloride: 101 mmol/L (ref 101–111)
GFR, EST AFRICAN AMERICAN: 56 mL/min — AB (ref >60)
GFR, EST NON AFRICAN AMERICAN: 48 mL/min — AB (ref >60)
Glucose, Bld: 109 mg/dL — ABNORMAL HIGH (ref 65–99)
Potassium: 3.6 mmol/L (ref 3.5–5.1)
Sodium: 137 mmol/L (ref 135–145)
TOTAL PROTEIN: 6.3 g/dL — AB (ref 6.5–8.1)

## 2017-08-04 LAB — CBC
HCT: 38.4 % (ref 36.0–46.0)
Hemoglobin: 12.6 g/dL (ref 12.0–15.0)
MCH: 27.5 pg (ref 26.0–34.0)
MCHC: 32.8 g/dL (ref 30.0–36.0)
MCV: 83.7 fL (ref 78.0–100.0)
PLATELETS: 290 10*3/uL (ref 150–400)
RBC: 4.59 MIL/uL (ref 3.87–5.11)
RDW: 14 % (ref 11.5–15.5)
WBC: 12.4 10*3/uL — AB (ref 4.0–10.5)

## 2017-08-04 LAB — TROPONIN I

## 2017-08-04 LAB — MRSA PCR SCREENING: MRSA BY PCR: NEGATIVE

## 2017-08-04 MED ORDER — ATORVASTATIN CALCIUM 80 MG PO TABS: 80.0000 mg | ORAL_TABLET | Freq: Every day | ORAL | 3 refills | Status: DC

## 2017-08-04 MED ORDER — LISINOPRIL 5 MG PO TABS: 5.0000 mg | ORAL_TABLET | Freq: Every day | ORAL | 11 refills | Status: DC

## 2017-08-04 MED ORDER — INFLUENZA VAC SPLIT QUAD 0.5 ML IM SUSY: 0.5000 mL | PREFILLED_SYRINGE | INTRAMUSCULAR | Status: DC

## 2017-08-04 NOTE — Discharge Summary (Signed)
Physician Discharge Summary  Zilda No DJM:[id number] DOB: 15-Jul-1961 DOA: 08/03/2017  PCP: Bartholome Bill, MD  Admit date: 08/03/2017 Discharge date: 08/04/2017  Admitted From: Home Discharge disposition: Home   Recommendations for Outpatient Follow-Up:   1. Follow-up with PCP in one week. Would recheck a CBC to ensure resolution of leukocytosis.   Discharge Diagnosis:   Principal Problem:   Chest pain, Pleuritic, likely from pleurisy Active Problems:   Leukocytosis   Tachycardia   Hyperglycemia  Discharge Condition: Improved.  Diet recommendation: Low sodium, heart healthy.    History of Present Illness:   Dana Hamilton is an 56 y.o. female with a PMH of hypertension who was admitted 08/03/17 for evaluation of chest pain.  Hospital Course by Problem:   Principal Problem:   Chest pain Troponins negative. Pain described as pleuritic. Likely pleurisy. Could consider outpatient 2-D echo if pain unrelieved with conservative measures. Cleared for discharge by cardiologist.  Active Problems:   Aortic calcification Placed on statin therapy per cardiology recommendations, LDL goal less than 70. Fasting lipid panel checked prior to discharge and will need to be followed up on by PCP.    Leukocytosis Suspect etiology is from a viral pleurisy.    Tachycardia Heart rate in the 70s at discharge.    Hyperglycemia Mild. Glucose 109 on chemistries. Hemoglobin A1c 5.5%.   Medical Consultants:    Cardiology   Discharge Exam:   Vitals:   08/04/17 0821 08/04/17 1212  BP: 107/66 111/65  Pulse: 77   Resp:    Temp: 98.4 F (36.9 C) 98.7 F (37.1 C)  SpO2: 97% 97%   Vitals:   08/04/17 0557 08/04/17 0800 08/04/17 0821 08/04/17 1212  BP: 98/64 94/70 107/66 111/65  Pulse: 73 75 77   Resp: 16     Temp: 98.1 F (36.7 C)  98.4 F (36.9 C) 98.7 F (37.1 C)  TempSrc: Oral  Oral Oral  SpO2: 97%  97% 97%  Weight: 101.3 kg (223 lb 4.8 oz)     Height:          General exam: Appears calm and comfortable.  Respiratory system: Clear to auscultation. Respiratory effort normal. Cardiovascular system: S1 & S2 heard, RRR. No JVD,  rubs, gallops or clicks. No murmurs. Gastrointestinal system: Abdomen is nondistended, soft and nontender. No organomegaly or masses felt. Normal bowel sounds heard. Central nervous system: Alert and oriented. No focal neurological deficits. Extremities: No clubbing,  or cyanosis. No edema. Skin: No rashes, lesions or ulcers. Psychiatry: Judgement and insight appear normal. Mood & affect appropriate.    The results of significant diagnostics from this hospitalization (including imaging, microbiology, ancillary and laboratory) are listed below for reference.     Procedures and Diagnostic Studies:   Dg Chest 2 View  Result Date: 08/03/2017 CLINICAL DATA:  Mid chest pain. Abnormal EKG no respiratory distress. History of hypertension, former smoker. EXAM: CHEST  2 VIEW COMPARISON:  None in PACs FINDINGS: The lungs are borderline hypoinflated but clear. The heart and pulmonary vascularity are normal. The mediastinum is normal in width. There is no pleural effusion. The bony thorax exhibits no acute abnormality. IMPRESSION: There is no CHF nor other acute cardiopulmonary abnormality. Electronically Signed   By: David  Martinique M.D.   On: 08/03/2017 16:54   Ct Angio Chest Pe W And/or Wo Contrast  Result Date: 08/03/2017 CLINICAL DATA:  Chest pain shortness of breath.  Positive D-dimer. EXAM: CT ANGIOGRAPHY CHEST WITH CONTRAST TECHNIQUE: Multidetector CT imaging of the  chest was performed using the standard protocol during bolus administration of intravenous contrast. Multiplanar CT image reconstructions and MIPs were obtained to evaluate the vascular anatomy. CONTRAST:  100 mL of Isovue 370 intravenous contrast COMPARISON:  Current chest radiograph. FINDINGS: Cardiovascular: Satisfactory opacification of the pulmonary arteries to the  segmental level. No evidence of pulmonary embolism. Heart is normal in size. No pericardial effusion. Mild coronary artery calcifications. Great vessels are normal in caliber. No aortic dissection. No atherosclerosis. Mediastinum/Nodes: No enlarged mediastinal, hilar, or axillary lymph nodes. Thyroid gland, trachea, and esophagus demonstrate no significant findings. Lungs/Pleura: No focal lung consolidation. Mild areas of relative increased decreased attenuation consistent mosaic profusion. This is likely on the basis of small airways disease. No pulmonary edema. No lung mass or suspicious nodule. No pleural effusion or pneumothorax. Upper Abdomen: No acute abnormality. Musculoskeletal: No fracture or acute finding. No osteoblastic or osteolytic lesions. Mild disc degenerative changes noted throughout the thoracic spine Review of the MIP images confirms the above findings. IMPRESSION: 1. No evidence of a pulmonary embolism. 2. Mild mosaic profusion evident in the lungs most likely due to small airways disease. 3. No evidence of pneumonia.  No pulmonary edema. Electronically Signed   By: Lajean Manes M.D.   On: 08/03/2017 19:02     Labs:   Basic Metabolic Panel:  Recent Labs Lab 08/03/17 1612 08/04/17 0252  NA 135 137  K 4.5 3.6  CL 100* 101  CO2 26 27  GLUCOSE 109* 109*  BUN 19 20  CREATININE 1.17* 1.21*  CALCIUM 9.3 8.9   GFR Estimated Creatinine Clearance: 60.9 mL/min (A) (by C-G formula based on SCr of 1.21 mg/dL (H)). Liver Function Tests:  Recent Labs Lab 08/04/17 0252  AST 15  ALT 16  ALKPHOS 58  BILITOT 0.8  PROT 6.3*  ALBUMIN 3.5    CBC:  Recent Labs Lab 08/03/17 1612 08/04/17 0252  WBC 18.0* 12.4*  HGB 14.2 12.6  HCT 42.8 38.4  MCV 84.1 83.7  PLT 316 290   Cardiac Enzymes:  Recent Labs Lab 08/03/17 2111 08/03/17 2330 08/04/17 0252  TROPONINI <0.03 <0.03 <0.03   D-Dimer  Recent Labs  08/03/17 1612  DDIMER 0.80*   Hgb A1c  Recent Labs   08/03/17 2111  HGBA1C 5.5   Lipid Profile  Recent Labs  08/04/17 1344  CHOL 145  HDL 48  LDLCALC 89  TRIG 42  CHOLHDL 3.0   Thyroid function studies  Recent Labs  08/03/17 2111  TSH 0.620   Anemia work up No results for input(s): VITAMINB12, FOLATE, FERRITIN, TIBC, IRON, RETICCTPCT in the last 72 hours. Microbiology Recent Results (from the past 240 hour(s))  MRSA PCR Screening     Status: None   Collection Time: 08/04/17  2:31 AM  Result Value Ref Range Status   MRSA by PCR NEGATIVE NEGATIVE Final    Comment:        The GeneXpert MRSA Assay (FDA approved for NASAL specimens only), is one component of a comprehensive MRSA colonization surveillance program. It is not intended to diagnose MRSA infection nor to guide or monitor treatment for MRSA infections.      Discharge Instructions:   Discharge Instructions    Call MD for:  extreme fatigue    Complete by:  As directed    Call MD for:  severe uncontrolled pain    Complete by:  As directed    Diet - low sodium heart healthy    Complete by:  As  directed    Increase activity slowly    Complete by:  As directed      Allergies as of 08/04/2017   No Known Allergies     Medication List    STOP taking these medications   lisinopril-hydrochlorothiazide 10-12.5 MG tablet Commonly known as:  PRINZIDE,ZESTORETIC     TAKE these medications   acetaminophen 325 MG tablet Commonly known as:  TYLENOL Take 650 mg by mouth every 6 (six) hours as needed (for pain or headaches).   atorvastatin 80 MG tablet Commonly known as:  LIPITOR Take 1 tablet (80 mg total) by mouth daily at 6 PM.   buPROPion 300 MG 24 hr tablet Commonly known as:  WELLBUTRIN XL Take 300 mg by mouth daily.   ibuprofen 200 MG tablet Commonly known as:  ADVIL,MOTRIN Take 400 mg by mouth every 6 (six) hours as needed (for pain or headaches).   lisinopril 5 MG tablet Commonly known as:  PRINIVIL,ZESTRIL Take 1 tablet (5 mg total) by  mouth daily.   ONE-A-DAY WOMENS FORMULA PO Take 1 tablet by mouth daily.   Vitamin D3 2000 units capsule Take 2,000 Units by mouth daily.      Follow-up Information    Bartholome Bill, MD. Schedule an appointment as soon as possible for a visit in 1 week(s).   Specialty:  Family Medicine Why:  Hospital follow up. Contact information: [place] [phone]            Time coordinating discharge: 35 minutes.  Signed:  Taleigha Pinson  Pager [phone] [facility] 08/04/2017, 7:21 PM

## 2017-08-04 NOTE — Plan of Care (Signed)
Problem: Activity: Goal: Risk for activity intolerance will decrease Outcome: Completed/Met Date Met: 08/04/17 Pt up ad lib. Ambulates independently. Ambulated to bathroom and back to bed with ease

## 2017-08-04 NOTE — Discharge Instructions (Signed)
Atorvastatin tablets What is this medicine? ATORVASTATIN (a TORE va sta tin) is known as a HMG-CoA reductase inhibitor or 'statin'. It lowers the level of cholesterol and triglycerides in the blood. This drug may also reduce the risk of heart attack, stroke, or other health problems in patients with risk factors for heart disease. Diet and lifestyle changes are often used with this drug. This medicine may be used for other purposes; ask your health care provider or pharmacist if you have questions. COMMON BRAND NAME(S): Lipitor What should I tell my health care provider before I take this medicine? They need to know if you have any of these conditions: -frequently drink alcoholic beverages -history of stroke, TIA -kidney disease -liver disease -muscle aches or weakness -other medical condition -an unusual or allergic reaction to atorvastatin, other medicines, foods, dyes, or preservatives -pregnant or trying to get pregnant -breast-feeding How should I use this medicine? Take this medicine by mouth with a glass of water. Follow the directions on the prescription label. You can take this medicine with or without food. Take your doses at regular intervals. Do not take your medicine more often than directed. Talk to your pediatrician regarding the use of this medicine in children. While this drug may be prescribed for children as young as 35 years old for selected conditions, precautions do apply. Overdosage: If you think you have taken too much of this medicine contact a poison control center or emergency room at once. NOTE: This medicine is only for you. Do not share this medicine with others. What if I miss a dose? If you miss a dose, take it as soon as you can. If it is almost time for your next dose, take only that dose. Do not take double or extra doses. What may interact with this medicine? Do not take this medicine with any of the following medications: -red yeast  rice -telaprevir -telithromycin -voriconazole This medicine may also interact with the following medications: -alcohol -antiviral medicines for HIV or AIDS -boceprevir -certain antibiotics like clarithromycin, erythromycin, troleandomycin -certain medicines for cholesterol like fenofibrate or gemfibrozil -cimetidine -clarithromycin -colchicine -cyclosporine -digoxin -female hormones, like estrogens or progestins and birth control pills -grapefruit juice -medicines for fungal infections like fluconazole, itraconazole, ketoconazole -niacin -rifampin -spironolactone This list may not describe all possible interactions. Give your health care provider a list of all the medicines, herbs, non-prescription drugs, or dietary supplements you use. Also tell them if you smoke, drink alcohol, or use illegal drugs. Some items may interact with your medicine. What should I watch for while using this medicine? Visit your doctor or health care professional for regular check-ups. You may need regular tests to make sure your liver is working properly. Tell your doctor or health care professional right away if you get any unexplained muscle pain, tenderness, or weakness, especially if you also have a fever and tiredness. Your doctor or health care professional may tell you to stop taking this medicine if you develop muscle problems. If your muscle problems do not go away after stopping this medicine, contact your health care professional. This drug is only part of a total heart-health program. Your doctor or a dietician can suggest a low-cholesterol and low-fat diet to help. Avoid alcohol and smoking, and keep a proper exercise schedule. Do not use this drug if you are pregnant or breast-feeding. Serious side effects to an unborn child or to an infant are possible. Talk to your doctor or pharmacist for more information. This medicine may affect  blood sugar levels. If you have diabetes, check with your doctor  or health care professional before you change your diet or the dose of your diabetic medicine. If you are going to have surgery tell your health care professional that you are taking this drug. What side effects may I notice from receiving this medicine? Side effects that you should report to your doctor or health care professional as soon as possible: -allergic reactions like skin rash, itching or hives, swelling of the face, lips, or tongue -dark urine -fever -joint pain -muscle cramps, pain -redness, blistering, peeling or loosening of the skin, including inside the mouth -trouble passing urine or change in the amount of urine -unusually weak or tired -yellowing of eyes or skin Side effects that usually do not require medical attention (report to your doctor or health care professional if they continue or are bothersome): -constipation -heartburn -stomach gas, pain, upset This list may not describe all possible side effects. Call your doctor for medical advice about side effects. You may report side effects to FDA at 1-800-FDA-1088. Where should I keep my medicine? Keep out of the reach of children. Store at room temperature between 20 to 25 degrees C (68 to 77 degrees F). Throw away any unused medicine after the expiration date. NOTE: This sheet is a summary. It may not cover all possible information. If you have questions about this medicine, talk to your doctor, pharmacist, or health care provider.  2018 Elsevier/Gold Standard (2011-09-06 06:30:16)

## 2017-08-04 NOTE — Progress Notes (Signed)
DAILY PROGRESS NOTE   Patient Name: Dana Hamilton Date of Encounter: 08/04/2017  Hospital Problem List   Principal Problem:   Chest pain Active Problems:   Leukocytosis   Tachycardia   Hyperglycemia    Chief Complaint   Chest pressure has resolved  Subjective   No further chest pressure overnight. She said it was worse with taking a deep breath, constant, lasted for hours. WBC count was elevated, but is improved today without antibiotics. No fever overnight. Troponins negative. No ischemic EKG changes.    Objective   Vitals:   08/04/17 0130 08/04/17 0200 08/04/17 0557 08/04/17 0821  BP: 102/65 (!) 154/71 98/64 107/66  Pulse: 80 87 73 77  Resp: (!) _0 Temp:  98.4 F (36.9 C) 98.1 F (36.7 C) 98.4 F (36.9 C)  TempSrc:  Oral Oral Oral  SpO2: 97% 96% 97% 97%  Weight:   223 lb 4.8 oz (101.3 kg)   Height:       No intake or output data in the 24 hours ending 08/04/17 0849 Filed Weights   08/03/17 1556 08/04/17 0557  Weight: 220 lb (99.8 kg) 223 lb 4.8 oz (101.3 kg)    Physical Exam   General appearance: alert and no distress Neck: no carotid bruit, no JVD and thyroid not enlarged, symmetric, no tenderness/mass/nodules Lungs: clear to auscultation bilaterally Heart: regular rate and rhythm Abdomen: soft, non-tender; bowel sounds normal; no masses,  no organomegaly Extremities: extremities normal, atraumatic, no cyanosis or edema Pulses: 2+ and symmetric Skin: Skin color, texture, turgor normal. No rashes or lesions Neurologic: Grossly normal Psych: Pleasant  Inpatient Medications    Scheduled Meds: . aspirin EC  81 mg Oral Daily  . atorvastatin  80 mg Oral q1800  . buPROPion  300 mg Oral Daily  . carvedilol  6.25 mg Oral BID WC  . enoxaparin (LOVENOX) injection  40 mg Subcutaneous QHS  . [START ON 08/05/2017] Influenza vac split quadrivalent PF  0.5 mL Intramuscular Tomorrow-1000    Continuous Infusions:   PRN Meds: acetaminophen **OR**  acetaminophen   Labs   Results for orders placed or performed during the hospital encounter of 08/03/17 (from the past 48 hour(s))  Basic metabolic panel     Status: Abnormal   Collection Time: 08/03/17  4:12 PM  Result Value Ref Range   Sodium 135 135 - 145 mmol/L   Potassium 4.5 3.5 - 5.1 mmol/L   Chloride 100 (L) 101 - 111 mmol/L   CO2 26 22 - 32 mmol/L   Glucose, Bld 109 (H) 65 - 99 mg/dL   BUN 19 6 - 20 mg/dL   Creatinine, Ser 1.17 (H) 0.44 - 1.00 mg/dL   Calcium 9.3 8.9 - 10.3 mg/dL   GFR calc non Af Amer 50 (L) >60 mL/min   GFR calc Af Amer 58 (L) >60 mL/min    Comment: (NOTE) The eGFR has been calculated using the CKD EPI equation. This calculation has not been validated in all clinical situations. eGFR's persistently <60 mL/min signify possible Chronic Kidney Disease.    Anion gap 9 5 - 15  CBC     Status: Abnormal   Collection Time: 08/03/17  4:12 PM  Result Value Ref Range   WBC 18.0 (H) 4.0 - 10.5 K/uL   RBC 5.09 3.87 - 5.11 MIL/uL   Hemoglobin 14.2 12.0 - 15.0 g/dL   HCT 42.8 36.0 - 46.0 %   MCV 84.1 78.0 - 100.0 fL  MCH 27.9 26.0 - 34.0 pg   MCHC 33.2 30.0 - 36.0 g/dL   RDW 13.9 11.5 - 15.5 %   Platelets 316 150 - 400 K/uL  D-dimer, quantitative (not at [facility])     Status: Abnormal   Collection Time: 08/03/17  4:12 PM  Result Value Ref Range   D-Dimer, Quant 0.80 (H) 0.00 - 0.50 ug/mL-FEU    Comment: (NOTE) At the manufacturer cut-off of 0.50 ug/mL FEU, this assay has been documented to exclude PE with a sensitivity and negative predictive value of 97 to 99%.  At this time, this assay has not been approved by the FDA to exclude DVT/VTE. Results should be correlated with clinical presentation.   I-stat troponin, ED     Status: None   Collection Time: 08/03/17  4:31 PM  Result Value Ref Range   Troponin i, poc 0.01 0.00 - 0.08 ng/mL   Comment 3            Comment: Due to the release kinetics of cTnI, a negative result within the first hours of the  onset of symptoms does not rule out myocardial infarction with certainty. If myocardial infarction is still suspected, repeat the test at appropriate intervals.   Troponin I     Status: None   Collection Time: 08/03/17  9:11 PM  Result Value Ref Range   Troponin I <0.03 <0.03 ng/mL  TSH     Status: None   Collection Time: 08/03/17  9:11 PM  Result Value Ref Range   TSH 0.620 0.350 - 4.500 uIU/mL    Comment: Performed by a 3rd Generation assay with a functional sensitivity of <=0.01 uIU/mL.  Hemoglobin A1c     Status: None   Collection Time: 08/03/17  9:11 PM  Result Value Ref Range   Hgb A1c MFr Bld 5.5 4.8 - 5.6 %    Comment: (NOTE) Pre diabetes:          5.7%-6.4% Diabetes:              >6.4% Glycemic control for   <7.0% adults with diabetes    Mean Plasma Glucose 111.15 mg/dL  Troponin I     Status: None   Collection Time: 08/03/17 11:30 PM  Result Value Ref Range   Troponin I <0.03 <0.03 ng/mL  Comprehensive metabolic panel     Status: Abnormal   Collection Time: 08/04/17  2:52 AM  Result Value Ref Range   Sodium 137 135 - 145 mmol/L   Potassium 3.6 3.5 - 5.1 mmol/L    Comment: DELTA CHECK NOTED   Chloride 101 101 - 111 mmol/L   CO2 27 22 - 32 mmol/L   Glucose, Bld 109 (H) 65 - 99 mg/dL   BUN 20 6 - 20 mg/dL   Creatinine, Ser 1.21 (H) 0.44 - 1.00 mg/dL   Calcium 8.9 8.9 - 10.3 mg/dL   Total Protein 6.3 (L) 6.5 - 8.1 g/dL   Albumin 3.5 3.5 - 5.0 g/dL   AST 15 15 - 41 U/L   ALT 16 14 - 54 U/L   Alkaline Phosphatase 58 38 - 126 U/L   Total Bilirubin 0.8 0.3 - 1.2 mg/dL   GFR calc non Af Amer 48 (L) >60 mL/min   GFR calc Af Amer 56 (L) >60 mL/min    Comment: (NOTE) The eGFR has been calculated using the CKD EPI equation. This calculation has not been validated in all clinical situations. eGFR's persistently <60 mL/min signify possible Chronic Kidney  Disease.    Anion gap 9 5 - 15  CBC     Status: Abnormal   Collection Time: 08/04/17  2:52 AM  Result Value Ref  Range   WBC 12.4 (H) 4.0 - 10.5 K/uL   RBC 4.59 3.87 - 5.11 MIL/uL   Hemoglobin 12.6 12.0 - 15.0 g/dL   HCT 38.4 36.0 - 46.0 %   MCV 83.7 78.0 - 100.0 fL   MCH 27.5 26.0 - 34.0 pg   MCHC 32.8 30.0 - 36.0 g/dL   RDW 14.0 11.5 - 15.5 %   Platelets 290 150 - 400 K/uL  Troponin I     Status: None   Collection Time: 08/04/17  2:52 AM  Result Value Ref Range   Troponin I <0.03 <0.03 ng/mL    ECG   N/A - Personally Reviewed  Telemetry   Sinus rhythm - Personally Reviewed  Radiology    Dg Chest 2 View  Result Date: 08/03/2017 CLINICAL DATA:  Mid chest pain. Abnormal EKG no respiratory distress. History of hypertension, former smoker. EXAM: CHEST  2 VIEW COMPARISON:  None in PACs FINDINGS: The lungs are borderline hypoinflated but clear. The heart and pulmonary vascularity are normal. The mediastinum is normal in width. There is no pleural effusion. The bony thorax exhibits no acute abnormality. IMPRESSION: There is no CHF nor other acute cardiopulmonary abnormality. Electronically Signed   By: David  Martinique M.D.   On: 08/03/2017 16:54   Ct Angio Chest Pe W And/or Wo Contrast  Result Date: 08/03/2017 CLINICAL DATA:  Chest pain shortness of breath.  Positive D-dimer. EXAM: CT ANGIOGRAPHY CHEST WITH CONTRAST TECHNIQUE: Multidetector CT imaging of the chest was performed using the standard protocol during bolus administration of intravenous contrast. Multiplanar CT image reconstructions and MIPs were obtained to evaluate the vascular anatomy. CONTRAST:  100 mL of Isovue 370 intravenous contrast COMPARISON:  Current chest radiograph. FINDINGS: Cardiovascular: Satisfactory opacification of the pulmonary arteries to the segmental level. No evidence of pulmonary embolism. Heart is normal in size. No pericardial effusion. Mild coronary artery calcifications. Great vessels are normal in caliber. No aortic dissection. No atherosclerosis. Mediastinum/Nodes: No enlarged mediastinal, hilar, or axillary  lymph nodes. Thyroid gland, trachea, and esophagus demonstrate no significant findings. Lungs/Pleura: No focal lung consolidation. Mild areas of relative increased decreased attenuation consistent mosaic profusion. This is likely on the basis of small airways disease. No pulmonary edema. No lung mass or suspicious nodule. No pleural effusion or pneumothorax. Upper Abdomen: No acute abnormality. Musculoskeletal: No fracture or acute finding. No osteoblastic or osteolytic lesions. Mild disc degenerative changes noted throughout the thoracic spine Review of the MIP images confirms the above findings. IMPRESSION: 1. No evidence of a pulmonary embolism. 2. Mild mosaic profusion evident in the lungs most likely due to small airways disease. 3. No evidence of pneumonia.  No pulmonary edema. Electronically Signed   By: Lajean Manes M.D.   On: 08/03/2017 19:02    Cardiac Studies   N/A  Assessment   1. Principal Problem: 2.   Chest pain 3. Active Problems: 4.   Leukocytosis 5.   Tachycardia 6.   Hyperglycemia 7.   Plan   1. Symptoms very atypical for cardiac pain. Troponins negative. Given the pleuritic component, ?pleurisy - maybe viral, elevated WBC count - could be stress demargination or viral infection. There are mild coronary artery calcifications - started on high dose lipitor - therefore, would be reasonable to target LDL <70. Will need follow-up with PCP. Doubt  this is pericarditis, since it has resolved. Echo is pending.  No further suggestions. Follow-up PRN with cardiology and PCP for risk factor modification. Will sign-off. Call with questions.  Time Spent Directly with Patient:  I have spent a total of 15 minutes with the patient reviewing hospital notes, telemetry, EKGs, labs and examining the patient as well as establishing an assessment and plan that was discussed personally with the patient. > 50% of time was spent in direct patient care.  Length of Stay:  LOS: 0 days    Pixie Casino, MD, [place]  Attending Cardiologist  Direct Dial: [phone]  Fax: [phone]  Website:  [email or web address] 08/04/2017, 8:49 AM

## 2018-04-14 DIAGNOSIS — M25511 Pain in right shoulder: Secondary | ICD-10-CM | POA: Diagnosis not present

## 2018-04-19 DIAGNOSIS — M25511 Pain in right shoulder: Secondary | ICD-10-CM | POA: Diagnosis not present

## 2018-05-04 ENCOUNTER — Other Ambulatory Visit: Payer: Self-pay | Admitting: Family Medicine

## 2018-05-04 DIAGNOSIS — F411 Generalized anxiety disorder: Secondary | ICD-10-CM | POA: Diagnosis not present

## 2018-05-04 DIAGNOSIS — I10 Essential (primary) hypertension: Secondary | ICD-10-CM | POA: Diagnosis not present

## 2018-05-04 DIAGNOSIS — F330 Major depressive disorder, recurrent, mild: Secondary | ICD-10-CM | POA: Diagnosis not present

## 2018-05-04 DIAGNOSIS — Z1231 Encounter for screening mammogram for malignant neoplasm of breast: Secondary | ICD-10-CM

## 2018-05-04 DIAGNOSIS — E782 Mixed hyperlipidemia: Secondary | ICD-10-CM | POA: Diagnosis not present

## 2018-05-10 DIAGNOSIS — Z23 Encounter for immunization: Secondary | ICD-10-CM | POA: Diagnosis not present

## 2018-05-10 DIAGNOSIS — Z6834 Body mass index (BMI) 34.0-34.9, adult: Secondary | ICD-10-CM | POA: Diagnosis not present

## 2018-05-10 DIAGNOSIS — S51851A Open bite of right forearm, initial encounter: Secondary | ICD-10-CM | POA: Diagnosis not present

## 2018-05-17 DIAGNOSIS — M542 Cervicalgia: Secondary | ICD-10-CM | POA: Diagnosis not present

## 2018-06-04 DIAGNOSIS — F330 Major depressive disorder, recurrent, mild: Secondary | ICD-10-CM | POA: Diagnosis not present

## 2018-06-04 DIAGNOSIS — I10 Essential (primary) hypertension: Secondary | ICD-10-CM | POA: Diagnosis not present

## 2018-06-04 DIAGNOSIS — E782 Mixed hyperlipidemia: Secondary | ICD-10-CM | POA: Diagnosis not present

## 2018-06-04 DIAGNOSIS — F411 Generalized anxiety disorder: Secondary | ICD-10-CM | POA: Diagnosis not present

## 2018-06-05 ENCOUNTER — Ambulatory Visit
Admission: RE | Admit: 2018-06-05 | Discharge: 2018-06-05 | Disposition: A | Discharge status: Home / Self Care | Payer: BLUE CROSS/BLUE SHIELD | Source: Ambulatory Visit | Attending: Family Medicine | Admitting: Family Medicine

## 2018-06-05 DIAGNOSIS — Z1231 Encounter for screening mammogram for malignant neoplasm of breast: Secondary | ICD-10-CM | POA: Diagnosis not present

## 2018-06-21 ENCOUNTER — Other Ambulatory Visit: Payer: Self-pay | Admitting: Orthopedic Surgery

## 2018-06-21 DIAGNOSIS — M25511 Pain in right shoulder: Secondary | ICD-10-CM | POA: Diagnosis not present

## 2018-06-21 DIAGNOSIS — M542 Cervicalgia: Secondary | ICD-10-CM

## 2018-06-24 ENCOUNTER — Ambulatory Visit
Admission: RE | Admit: 2018-06-24 | Discharge: 2018-06-24 | Disposition: A | Discharge status: Home / Self Care | Payer: BLUE CROSS/BLUE SHIELD | Source: Ambulatory Visit | Attending: Orthopedic Surgery | Admitting: Orthopedic Surgery

## 2018-06-24 DIAGNOSIS — M542 Cervicalgia: Secondary | ICD-10-CM

## 2018-06-24 DIAGNOSIS — M4802 Spinal stenosis, cervical region: Secondary | ICD-10-CM | POA: Diagnosis not present

## 2018-06-27 DIAGNOSIS — Z23 Encounter for immunization: Secondary | ICD-10-CM | POA: Diagnosis not present

## 2018-06-27 DIAGNOSIS — Z6834 Body mass index (BMI) 34.0-34.9, adult: Secondary | ICD-10-CM | POA: Diagnosis not present

## 2018-06-27 DIAGNOSIS — F411 Generalized anxiety disorder: Secondary | ICD-10-CM | POA: Diagnosis not present

## 2018-06-27 DIAGNOSIS — G4700 Insomnia, unspecified: Secondary | ICD-10-CM | POA: Diagnosis not present

## 2018-06-27 DIAGNOSIS — F330 Major depressive disorder, recurrent, mild: Secondary | ICD-10-CM | POA: Diagnosis not present

## 2018-07-03 DIAGNOSIS — M4802 Spinal stenosis, cervical region: Secondary | ICD-10-CM | POA: Diagnosis not present

## 2018-08-02 ENCOUNTER — Other Ambulatory Visit: Payer: Self-pay | Admitting: Family Medicine

## 2018-08-02 ENCOUNTER — Ambulatory Visit
Admission: RE | Admit: 2018-08-02 | Discharge: 2018-08-02 | Disposition: A | Discharge status: Home / Self Care | Payer: BLUE CROSS/BLUE SHIELD | Source: Ambulatory Visit | Attending: Family Medicine | Admitting: Family Medicine

## 2018-08-02 DIAGNOSIS — R52 Pain, unspecified: Secondary | ICD-10-CM

## 2018-08-02 DIAGNOSIS — S99911A Unspecified injury of right ankle, initial encounter: Secondary | ICD-10-CM | POA: Diagnosis not present

## 2018-08-02 DIAGNOSIS — M25571 Pain in right ankle and joints of right foot: Secondary | ICD-10-CM | POA: Diagnosis not present

## 2018-08-02 DIAGNOSIS — M7989 Other specified soft tissue disorders: Secondary | ICD-10-CM | POA: Diagnosis not present

## 2018-08-29 DIAGNOSIS — H5213 Myopia, bilateral: Secondary | ICD-10-CM | POA: Diagnosis not present

## 2018-08-29 DIAGNOSIS — H5000 Unspecified esotropia: Secondary | ICD-10-CM | POA: Diagnosis not present

## 2018-08-29 DIAGNOSIS — H2513 Age-related nuclear cataract, bilateral: Secondary | ICD-10-CM | POA: Diagnosis not present

## 2018-08-29 DIAGNOSIS — H4922 Sixth [abducent] nerve palsy, left eye: Secondary | ICD-10-CM | POA: Diagnosis not present

## 2018-09-04 DIAGNOSIS — E782 Mixed hyperlipidemia: Secondary | ICD-10-CM | POA: Diagnosis not present

## 2018-09-04 DIAGNOSIS — I10 Essential (primary) hypertension: Secondary | ICD-10-CM | POA: Diagnosis not present

## 2018-09-10 DIAGNOSIS — F330 Major depressive disorder, recurrent, mild: Secondary | ICD-10-CM | POA: Diagnosis not present

## 2018-09-10 DIAGNOSIS — I10 Essential (primary) hypertension: Secondary | ICD-10-CM | POA: Diagnosis not present

## 2018-09-10 DIAGNOSIS — F411 Generalized anxiety disorder: Secondary | ICD-10-CM | POA: Diagnosis not present

## 2018-09-10 DIAGNOSIS — E782 Mixed hyperlipidemia: Secondary | ICD-10-CM | POA: Diagnosis not present

## 2018-10-08 DIAGNOSIS — H532 Diplopia: Secondary | ICD-10-CM | POA: Diagnosis not present

## 2018-10-08 DIAGNOSIS — H524 Presbyopia: Secondary | ICD-10-CM | POA: Diagnosis not present

## 2018-10-08 DIAGNOSIS — H50812 Duane's syndrome, left eye: Secondary | ICD-10-CM | POA: Diagnosis not present

## 2018-10-22 DIAGNOSIS — Z13220 Encounter for screening for lipoid disorders: Secondary | ICD-10-CM | POA: Diagnosis not present

## 2018-10-22 DIAGNOSIS — G4700 Insomnia, unspecified: Secondary | ICD-10-CM | POA: Diagnosis not present

## 2018-10-22 DIAGNOSIS — F411 Generalized anxiety disorder: Secondary | ICD-10-CM | POA: Diagnosis not present

## 2018-10-22 DIAGNOSIS — F330 Major depressive disorder, recurrent, mild: Secondary | ICD-10-CM | POA: Diagnosis not present

## 2018-10-22 DIAGNOSIS — E782 Mixed hyperlipidemia: Secondary | ICD-10-CM | POA: Diagnosis not present

## 2018-12-17 DIAGNOSIS — G4700 Insomnia, unspecified: Secondary | ICD-10-CM | POA: Diagnosis not present

## 2018-12-17 DIAGNOSIS — F411 Generalized anxiety disorder: Secondary | ICD-10-CM | POA: Diagnosis not present

## 2018-12-17 DIAGNOSIS — H50812 Duane's syndrome, left eye: Secondary | ICD-10-CM | POA: Diagnosis not present

## 2018-12-17 DIAGNOSIS — F330 Major depressive disorder, recurrent, mild: Secondary | ICD-10-CM | POA: Diagnosis not present

## 2018-12-17 DIAGNOSIS — H524 Presbyopia: Secondary | ICD-10-CM | POA: Diagnosis not present

## 2018-12-17 DIAGNOSIS — H532 Diplopia: Secondary | ICD-10-CM | POA: Diagnosis not present

## 2018-12-17 DIAGNOSIS — Z6836 Body mass index (BMI) 36.0-36.9, adult: Secondary | ICD-10-CM | POA: Diagnosis not present

## 2019-01-28 DIAGNOSIS — F411 Generalized anxiety disorder: Secondary | ICD-10-CM | POA: Diagnosis not present

## 2019-01-28 DIAGNOSIS — E782 Mixed hyperlipidemia: Secondary | ICD-10-CM | POA: Diagnosis not present

## 2019-01-28 DIAGNOSIS — G4700 Insomnia, unspecified: Secondary | ICD-10-CM | POA: Diagnosis not present

## 2019-01-28 DIAGNOSIS — E669 Obesity, unspecified: Secondary | ICD-10-CM | POA: Diagnosis not present

## 2019-04-15 DIAGNOSIS — Z0000 Encounter for general adult medical examination without abnormal findings: Secondary | ICD-10-CM | POA: Diagnosis not present

## 2019-04-18 DIAGNOSIS — Z6836 Body mass index (BMI) 36.0-36.9, adult: Secondary | ICD-10-CM | POA: Diagnosis not present

## 2019-04-18 DIAGNOSIS — Z1211 Encounter for screening for malignant neoplasm of colon: Secondary | ICD-10-CM | POA: Diagnosis not present

## 2019-04-18 DIAGNOSIS — Z0000 Encounter for general adult medical examination without abnormal findings: Secondary | ICD-10-CM | POA: Diagnosis not present

## 2019-05-01 DIAGNOSIS — H50812 Duane's syndrome, left eye: Secondary | ICD-10-CM | POA: Diagnosis not present

## 2019-05-01 DIAGNOSIS — H532 Diplopia: Secondary | ICD-10-CM | POA: Diagnosis not present

## 2019-05-01 DIAGNOSIS — H524 Presbyopia: Secondary | ICD-10-CM | POA: Diagnosis not present

## 2019-05-15 IMAGING — CR DG ANKLE COMPLETE 3+V*R*
3 series · 3 of 3 positions shown · non-contrast
Comparison: None.

CLINICAL DATA: Lateral right ankle pain and swelling following a
twisting injury today.

EXAM:
RIGHT ANKLE - COMPLETE 3+ VIEW

[x ankle ap right]
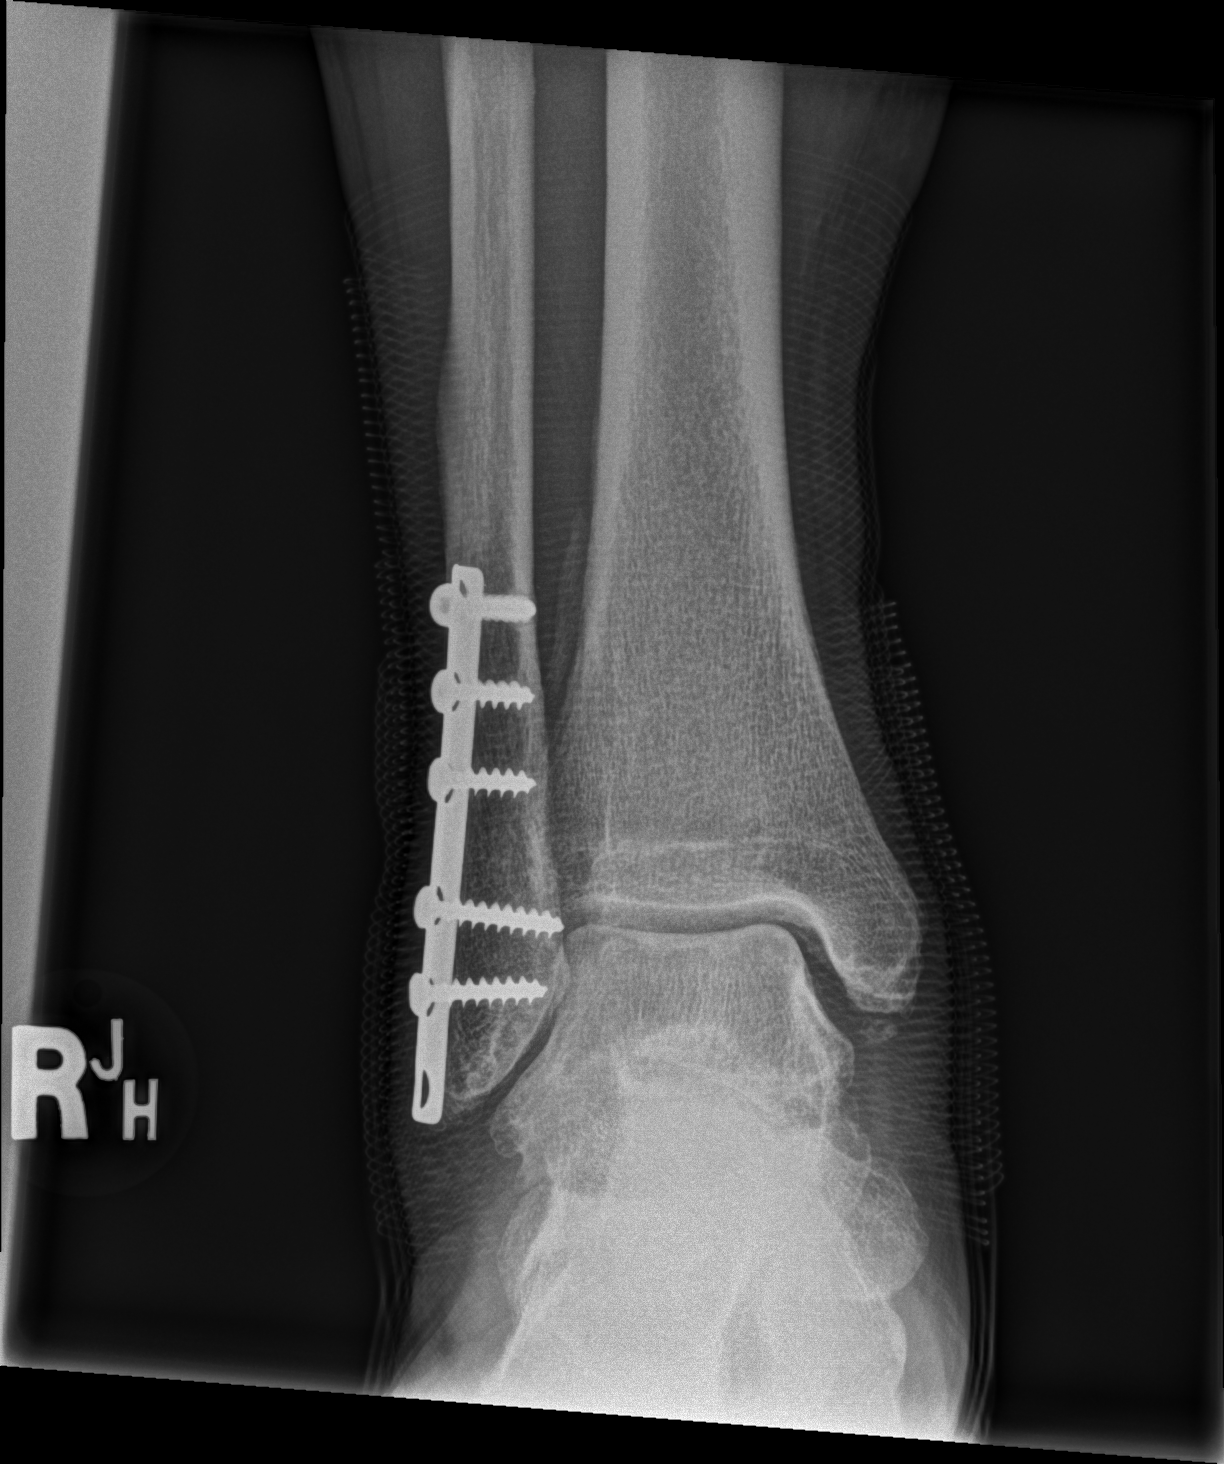

[x ankle obl right]
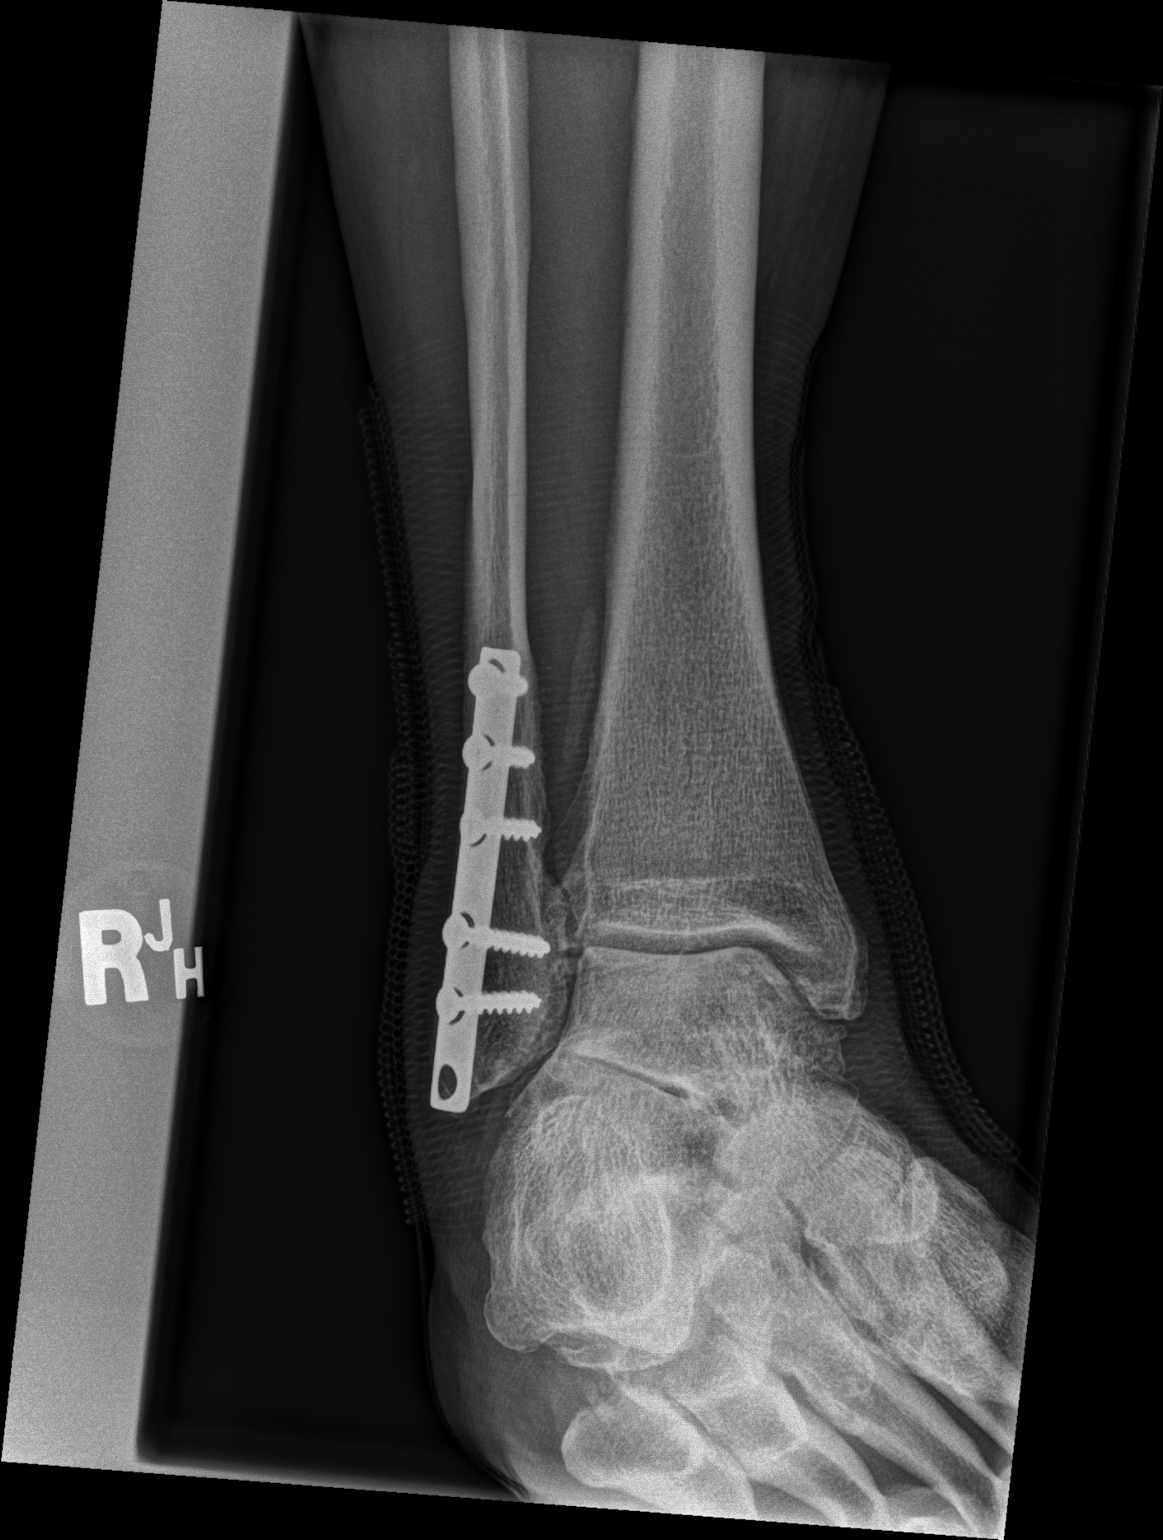

[x ankle lat right]
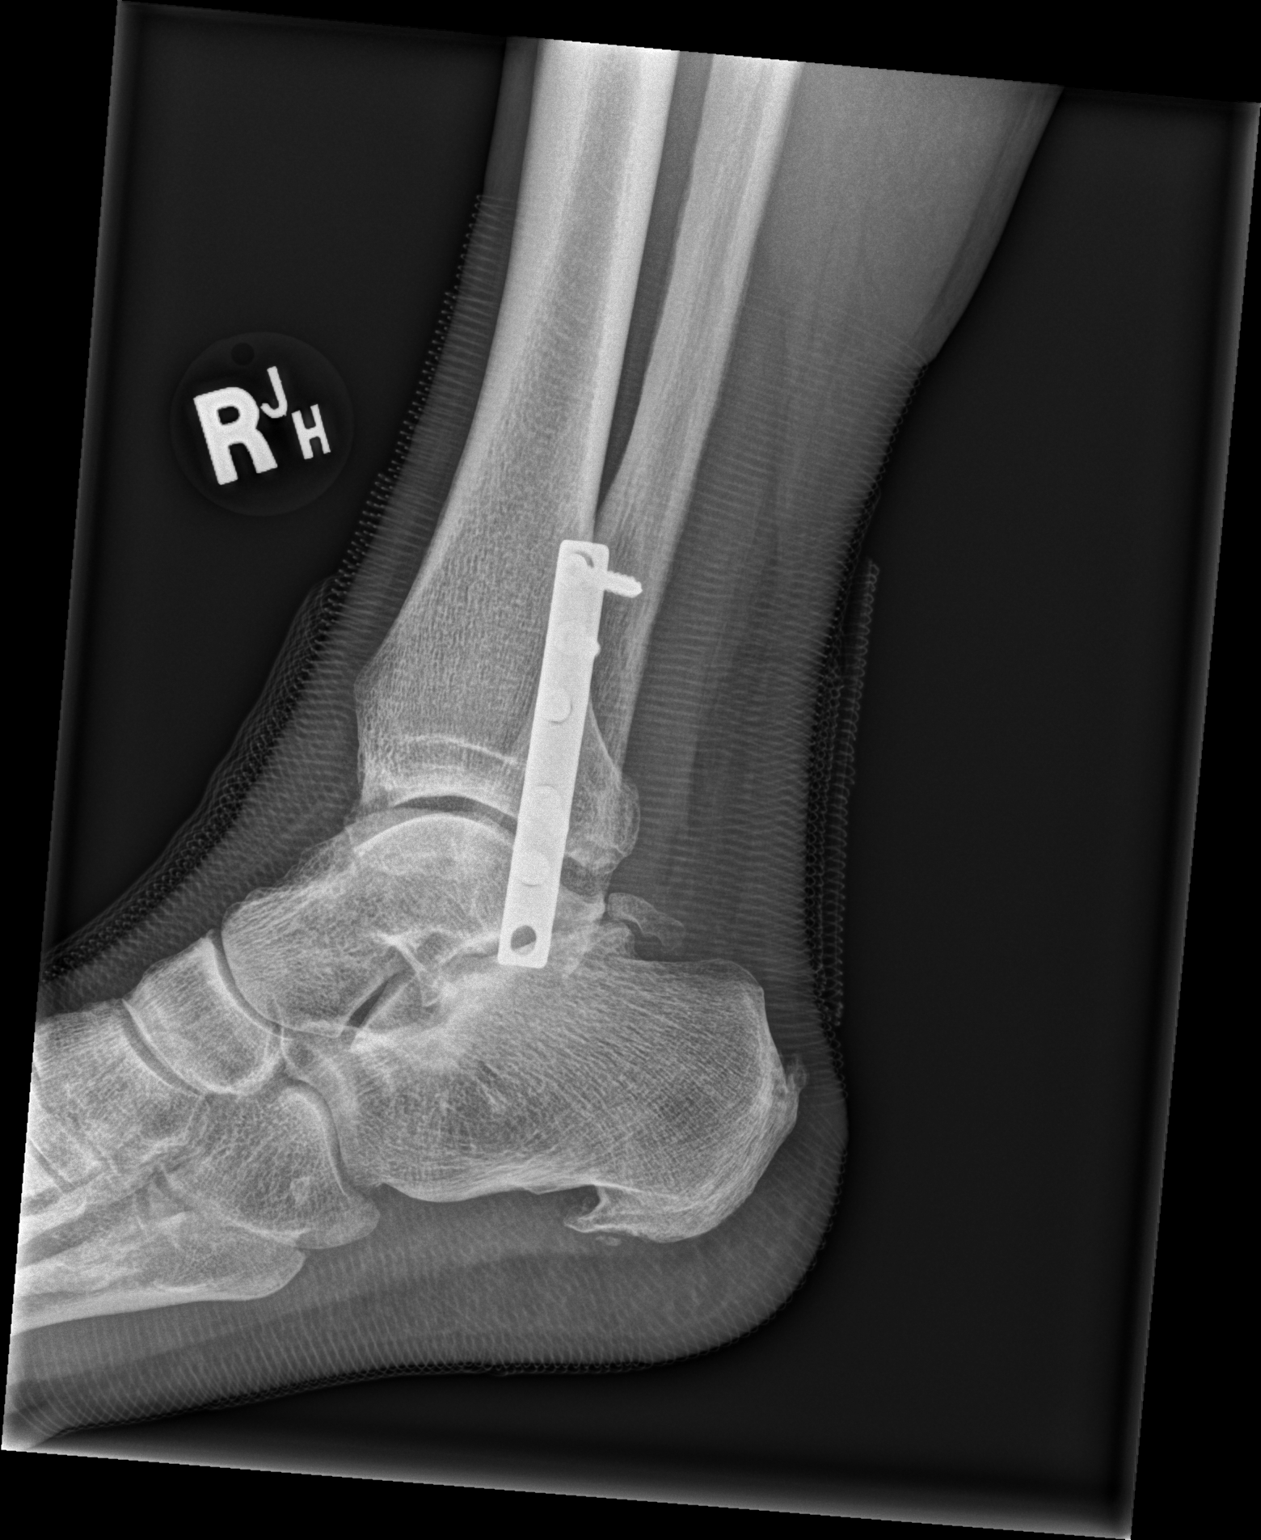

[3 of 3 positions shown; findings below may reference images not displayed]

FINDINGS: Screw plate fixation of the lateral malleolus and distal fibula.
Mild corticated fragmentation of the distal medial malleolus. No
acute fracture or dislocation seen. No effusion visualized. Moderate
calcaneal spur formation.
IMPRESSION: No acute fracture or dislocation.

## 2019-05-17 ENCOUNTER — Other Ambulatory Visit: Payer: Self-pay | Admitting: Family Medicine

## 2019-05-17 DIAGNOSIS — Z1231 Encounter for screening mammogram for malignant neoplasm of breast: Secondary | ICD-10-CM

## 2019-07-02 ENCOUNTER — Other Ambulatory Visit: Payer: Self-pay

## 2019-07-02 ENCOUNTER — Ambulatory Visit
Admission: RE | Admit: 2019-07-02 | Discharge: 2019-07-02 | Disposition: A | Discharge status: Home / Self Care | Payer: BC Managed Care – PPO | Source: Ambulatory Visit | Attending: Family Medicine | Admitting: Family Medicine

## 2019-07-02 DIAGNOSIS — Z1231 Encounter for screening mammogram for malignant neoplasm of breast: Secondary | ICD-10-CM

## 2019-09-10 DIAGNOSIS — H5213 Myopia, bilateral: Secondary | ICD-10-CM | POA: Diagnosis not present

## 2019-09-10 DIAGNOSIS — H2513 Age-related nuclear cataract, bilateral: Secondary | ICD-10-CM | POA: Diagnosis not present

## 2019-09-10 DIAGNOSIS — H52203 Unspecified astigmatism, bilateral: Secondary | ICD-10-CM | POA: Diagnosis not present

## 2019-10-07 DIAGNOSIS — E782 Mixed hyperlipidemia: Secondary | ICD-10-CM | POA: Diagnosis not present

## 2019-10-07 DIAGNOSIS — I10 Essential (primary) hypertension: Secondary | ICD-10-CM | POA: Diagnosis not present

## 2019-10-07 DIAGNOSIS — F411 Generalized anxiety disorder: Secondary | ICD-10-CM | POA: Diagnosis not present

## 2019-10-07 DIAGNOSIS — F330 Major depressive disorder, recurrent, mild: Secondary | ICD-10-CM | POA: Diagnosis not present

## 2019-11-19 DIAGNOSIS — G4700 Insomnia, unspecified: Secondary | ICD-10-CM | POA: Diagnosis not present

## 2019-11-19 DIAGNOSIS — F330 Major depressive disorder, recurrent, mild: Secondary | ICD-10-CM | POA: Diagnosis not present

## 2019-11-19 DIAGNOSIS — Z6837 Body mass index (BMI) 37.0-37.9, adult: Secondary | ICD-10-CM | POA: Diagnosis not present

## 2019-11-19 DIAGNOSIS — F411 Generalized anxiety disorder: Secondary | ICD-10-CM | POA: Diagnosis not present

## 2020-02-11 DIAGNOSIS — E663 Overweight: Secondary | ICD-10-CM | POA: Diagnosis not present

## 2020-02-11 DIAGNOSIS — F411 Generalized anxiety disorder: Secondary | ICD-10-CM | POA: Diagnosis not present

## 2020-02-11 DIAGNOSIS — K5900 Constipation, unspecified: Secondary | ICD-10-CM | POA: Diagnosis not present

## 2020-03-06 DIAGNOSIS — Z03818 Encounter for observation for suspected exposure to other biological agents ruled out: Secondary | ICD-10-CM | POA: Diagnosis not present

## 2020-03-06 DIAGNOSIS — J069 Acute upper respiratory infection, unspecified: Secondary | ICD-10-CM | POA: Diagnosis not present

## 2020-03-06 DIAGNOSIS — Z20828 Contact with and (suspected) exposure to other viral communicable diseases: Secondary | ICD-10-CM | POA: Diagnosis not present

## 2020-03-19 DIAGNOSIS — K5900 Constipation, unspecified: Secondary | ICD-10-CM | POA: Diagnosis not present

## 2020-03-27 DIAGNOSIS — R6889 Other general symptoms and signs: Secondary | ICD-10-CM | POA: Diagnosis not present

## 2020-03-27 DIAGNOSIS — I10 Essential (primary) hypertension: Secondary | ICD-10-CM | POA: Diagnosis not present

## 2020-03-27 DIAGNOSIS — R3129 Other microscopic hematuria: Secondary | ICD-10-CM | POA: Diagnosis not present

## 2020-04-17 DIAGNOSIS — Z1159 Encounter for screening for other viral diseases: Secondary | ICD-10-CM | POA: Diagnosis not present

## 2020-04-17 DIAGNOSIS — E782 Mixed hyperlipidemia: Secondary | ICD-10-CM | POA: Diagnosis not present

## 2020-04-17 DIAGNOSIS — Z0000 Encounter for general adult medical examination without abnormal findings: Secondary | ICD-10-CM | POA: Diagnosis not present

## 2020-04-22 DIAGNOSIS — Z01419 Encounter for gynecological examination (general) (routine) without abnormal findings: Secondary | ICD-10-CM | POA: Diagnosis not present

## 2020-04-22 DIAGNOSIS — M2041 Other hammer toe(s) (acquired), right foot: Secondary | ICD-10-CM | POA: Diagnosis not present

## 2020-04-22 DIAGNOSIS — Z0000 Encounter for general adult medical examination without abnormal findings: Secondary | ICD-10-CM | POA: Diagnosis not present

## 2020-04-22 DIAGNOSIS — M2042 Other hammer toe(s) (acquired), left foot: Secondary | ICD-10-CM | POA: Diagnosis not present

## 2020-04-22 DIAGNOSIS — Z23 Encounter for immunization: Secondary | ICD-10-CM | POA: Diagnosis not present

## 2020-04-22 DIAGNOSIS — Z1211 Encounter for screening for malignant neoplasm of colon: Secondary | ICD-10-CM | POA: Diagnosis not present

## 2020-08-11 DIAGNOSIS — F331 Major depressive disorder, recurrent, moderate: Secondary | ICD-10-CM | POA: Diagnosis not present

## 2020-08-11 DIAGNOSIS — G4700 Insomnia, unspecified: Secondary | ICD-10-CM | POA: Diagnosis not present

## 2020-08-11 DIAGNOSIS — F411 Generalized anxiety disorder: Secondary | ICD-10-CM | POA: Diagnosis not present

## 2020-08-11 DIAGNOSIS — I10 Essential (primary) hypertension: Secondary | ICD-10-CM | POA: Diagnosis not present

## 2020-08-13 DIAGNOSIS — Z23 Encounter for immunization: Secondary | ICD-10-CM | POA: Diagnosis not present

## 2020-09-29 DIAGNOSIS — J069 Acute upper respiratory infection, unspecified: Secondary | ICD-10-CM | POA: Diagnosis not present

## 2020-11-11 DIAGNOSIS — H2513 Age-related nuclear cataract, bilateral: Secondary | ICD-10-CM | POA: Diagnosis not present

## 2020-11-11 DIAGNOSIS — H50812 Duane's syndrome, left eye: Secondary | ICD-10-CM | POA: Diagnosis not present

## 2020-11-11 DIAGNOSIS — H5213 Myopia, bilateral: Secondary | ICD-10-CM | POA: Diagnosis not present

## 2020-11-11 DIAGNOSIS — H524 Presbyopia: Secondary | ICD-10-CM | POA: Diagnosis not present

## 2021-02-19 DIAGNOSIS — Z7185 Encounter for immunization safety counseling: Secondary | ICD-10-CM | POA: Diagnosis not present

## 2021-02-19 DIAGNOSIS — F411 Generalized anxiety disorder: Secondary | ICD-10-CM | POA: Diagnosis not present

## 2021-02-19 DIAGNOSIS — F331 Major depressive disorder, recurrent, moderate: Secondary | ICD-10-CM | POA: Diagnosis not present

## 2021-03-22 DIAGNOSIS — H43811 Vitreous degeneration, right eye: Secondary | ICD-10-CM | POA: Diagnosis not present

## 2021-04-22 DIAGNOSIS — H43811 Vitreous degeneration, right eye: Secondary | ICD-10-CM | POA: Diagnosis not present

## 2021-04-23 DIAGNOSIS — R739 Hyperglycemia, unspecified: Secondary | ICD-10-CM | POA: Diagnosis not present

## 2021-04-23 DIAGNOSIS — Z0000 Encounter for general adult medical examination without abnormal findings: Secondary | ICD-10-CM | POA: Diagnosis not present

## 2021-04-28 DIAGNOSIS — R011 Cardiac murmur, unspecified: Secondary | ICD-10-CM | POA: Diagnosis not present

## 2021-04-28 DIAGNOSIS — F411 Generalized anxiety disorder: Secondary | ICD-10-CM | POA: Diagnosis not present

## 2021-04-28 DIAGNOSIS — Z0000 Encounter for general adult medical examination without abnormal findings: Secondary | ICD-10-CM | POA: Diagnosis not present

## 2021-04-28 DIAGNOSIS — Z1211 Encounter for screening for malignant neoplasm of colon: Secondary | ICD-10-CM | POA: Diagnosis not present

## 2021-04-28 DIAGNOSIS — R739 Hyperglycemia, unspecified: Secondary | ICD-10-CM | POA: Diagnosis not present

## 2021-04-28 DIAGNOSIS — R7303 Prediabetes: Secondary | ICD-10-CM | POA: Diagnosis not present

## 2021-05-19 DIAGNOSIS — U071 COVID-19: Secondary | ICD-10-CM | POA: Diagnosis not present

## 2021-06-21 DIAGNOSIS — G4700 Insomnia, unspecified: Secondary | ICD-10-CM | POA: Diagnosis not present

## 2021-06-21 DIAGNOSIS — F411 Generalized anxiety disorder: Secondary | ICD-10-CM | POA: Diagnosis not present

## 2021-06-21 DIAGNOSIS — F331 Major depressive disorder, recurrent, moderate: Secondary | ICD-10-CM | POA: Diagnosis not present

## 2021-06-21 DIAGNOSIS — E663 Overweight: Secondary | ICD-10-CM | POA: Diagnosis not present

## 2021-06-30 ENCOUNTER — Ambulatory Visit: Payer: BC Managed Care – PPO | Admitting: Cardiology

## 2021-06-30 ENCOUNTER — Encounter: Payer: Self-pay | Admitting: Cardiology

## 2021-06-30 ENCOUNTER — Other Ambulatory Visit: Payer: Self-pay

## 2021-06-30 VITALS — BP 138/82 | HR 66 | Temp 97.7°F | Resp 16 | Ht 66.0 in | Wt 245.2 lb

## 2021-06-30 DIAGNOSIS — I10 Essential (primary) hypertension: Secondary | ICD-10-CM

## 2021-06-30 DIAGNOSIS — Z8679 Personal history of other diseases of the circulatory system: Secondary | ICD-10-CM

## 2021-06-30 DIAGNOSIS — E782 Mixed hyperlipidemia: Secondary | ICD-10-CM | POA: Diagnosis not present

## 2021-06-30 DIAGNOSIS — Z8616 Personal history of COVID-19: Secondary | ICD-10-CM

## 2021-06-30 DIAGNOSIS — R011 Cardiac murmur, unspecified: Secondary | ICD-10-CM | POA: Diagnosis not present

## 2021-06-30 DIAGNOSIS — Z6839 Body mass index (BMI) 39.0-39.9, adult: Secondary | ICD-10-CM

## 2021-06-30 DIAGNOSIS — E6609 Other obesity due to excess calories: Secondary | ICD-10-CM

## 2021-06-30 DIAGNOSIS — Z87891 Personal history of nicotine dependence: Secondary | ICD-10-CM

## 2021-06-30 DIAGNOSIS — R7303 Prediabetes: Secondary | ICD-10-CM

## 2021-06-30 NOTE — Progress Notes (Signed)
Date:  06/30/2021   ID:  Dana Hamilton, DOB December 03, 1960, MRN [id number]  PCP:  Fanny Bien, MD  Cardiologist:  Rex Kras, DO, [facility] (established care 06/30/2021)  REASON FOR CONSULT: Murmur  REQUESTING PHYSICIAN:  Bartholome Bill, MD [name],  [name] [id number]  Chief Complaint  Patient presents with   Heart Murmur    HPI  Dana Hamilton is a 60 y.o. female who presents to the office with a chief complaint of " evaluation of cardiac murmur." Patient's past medical history and cardiovascular risk factors include: Hypertension, hyperlipidemia, prediabetes, insomnia, general anxiety disorder, postmenopausal female, history of rheumatic fever, history of COVID-19 infection, obesity due to excess calories, former smoker.  She is referred to the office at the request of Bartholome Bill, MD for evaluation of cardiac murmur.  Patient is referred to the office after being seen by PCP for evaluation of a cardiac murmur.  Patient states that this was a new finding on a recent evaluation.  Interestingly, patient has a history of rheumatic fever at the age of 20 and used to have serial EKGs performed while growing up.  She does not recall having an echocardiogram in the past.  She was also treated prophylactically with penicillin for approximately 20 years.  She denies any chest pain to suggest ACS or heart failure symptoms.  No family history of premature coronary artery disease or sudden cardiac death.  Patient states that mom has a history of atrial fibrillation and underwent valve repair (most likely mitral).    FUNCTIONAL STATUS: Currently walking 30 minutes every day.   ALLERGIES: No Known Allergies  MEDICATION LIST PRIOR TO VISIT: Current Meds  Medication Sig   acetaminophen (TYLENOL) 325 MG tablet Take 650 mg by mouth every 6 (six) hours as needed (for pain or headaches).    atorvastatin (LIPITOR) 40 MG tablet Take 40 mg by mouth at  bedtime.   buPROPion (WELLBUTRIN XL) 300 MG 24 hr tablet Take 300 mg by mouth daily.   Cholecalciferol (VITAMIN D3) 2000 units capsule Take 2,000 Units by mouth daily.   escitalopram (LEXAPRO) 20 MG tablet Take 20 mg by mouth daily.   ibuprofen (ADVIL,MOTRIN) 200 MG tablet Take 400 mg by mouth every 6 (six) hours as needed (for pain or headaches).   lisinopril-hydrochlorothiazide (ZESTORETIC) 20-25 MG tablet Take 1 tablet by mouth daily.   Multiple Vitamins-Calcium (ONE-A-DAY WOMENS FORMULA PO) Take 1 tablet by mouth daily.   [DISCONTINUED] atorvastatin (LIPITOR) 80 MG tablet Take 1 tablet (80 mg total) by mouth daily at 6 PM.     PAST MEDICAL HISTORY: Past Medical History:  Diagnosis Date   Depression with anxiety    Headache    History of COVID-19    History of rheumatic fever    Hyperlipidemia    Hypertension    Prediabetes     PAST SURGICAL HISTORY: Past Surgical History:  Procedure Laterality Date   ANKLE FRACTURE SURGERY Right    TONSILLECTOMY      FAMILY HISTORY: The patient family history includes Atrial fibrillation in her mother; Colon cancer in her paternal grandmother; Heart disease in her maternal grandfather; Memory loss in her mother; Parkinson's disease in her father; Stroke in her paternal uncle; Transient ischemic attack in her paternal grandfather.  SOCIAL HISTORY:  The patient  reports that she quit smoking about 29 years ago. Her smoking use included cigarettes. She has a 3.00 pack-year smoking history. She has never used  smokeless tobacco. She reports current alcohol use. She reports that she does not currently use drugs after having used the following drugs: Marijuana.  REVIEW OF SYSTEMS: Review of Systems  Constitutional: Negative for chills and fever.  HENT:  Negative for hoarse voice and nosebleeds.   Eyes:  Negative for discharge, double vision and pain.  Cardiovascular:  Negative for chest pain, claudication, dyspnea on exertion, leg swelling,  near-syncope, orthopnea, palpitations, paroxysmal nocturnal dyspnea and syncope.  Respiratory:  Negative for hemoptysis and shortness of breath.   Musculoskeletal:  Negative for muscle cramps and myalgias.  Gastrointestinal:  Negative for abdominal pain, constipation, diarrhea, hematemesis, hematochezia, melena, nausea and vomiting.  Neurological:  Negative for dizziness and light-headedness.   PHYSICAL EXAM: Vitals with BMI 06/30/2021 06/30/2021 08/04/2017  Height - '5\' 6"'$  -  Weight - 245 lbs 3 oz -  BMI - 99991111 -  Systolic 0000000 [id number]  Diastolic 82 82 65  Pulse 66 76 -    CONSTITUTIONAL: Well-developed and well-nourished. No acute distress.  SKIN: Skin is warm and dry. No rash noted. No cyanosis. No pallor. No jaundice HEAD: Normocephalic and atraumatic.  EYES: No scleral icterus MOUTH/THROAT: Moist oral membranes.  NECK: No JVD present. No thyromegaly noted. No carotid bruits  LYMPHATIC: No visible cervical adenopathy.  CHEST Normal respiratory effort. No intercostal retractions  LUNGS: Clear to auscultation bilaterally.  No stridor. No wheezes. No rales.  CARDIOVASCULAR: Regular, positive Q000111Q, diastolic murmur heard at the apex, no gallops or rubs. ABDOMINAL: Obese, soft, nontender, nondistended, positive bowel sounds in all 4 quadrants no apparent ascites.  EXTREMITIES: No peripheral edema, 2+ DP and PT pulses. HEMATOLOGIC: No significant bruising NEUROLOGIC: Oriented to person, place, and time. Nonfocal. Normal muscle tone.  PSYCHIATRIC: Normal mood and affect. Normal behavior. Cooperative  CARDIAC DATABASE: EKG: 06/30/2021: Normal sinus rhythm, 63 bpm, RBBB, without underlying ischemia or injury pattern.   Echocardiogram: No results found for this or any previous visit from the past 1095 days.   Stress Testing: No results found for this or any previous visit from the past 1095 days.  Heart Catheterization: None  LABORATORY DATA: CBC Latest Ref Rng & Units 08/04/2017  08/03/2017  WBC 4.0 - 10.5 K/uL 12.4(H) 18.0(H)  Hemoglobin 12.0 - 15.0 g/dL 12.6 14.2  Hematocrit 36.0 - 46.0 % 38.4 42.8  Platelets 150 - 400 K/uL 290 316    CMP Latest Ref Rng & Units 08/04/2017 08/03/2017  Glucose 65 - 99 mg/dL 109(H) 109(H)  BUN 6 - 20 mg/dL 20 19  Creatinine 0.44 - 1.00 mg/dL 1.21(H) 1.17(H)  Sodium 135 - 145 mmol/L 137 135  Potassium 3.5 - 5.1 mmol/L 3.6 4.5  Chloride 101 - 111 mmol/L 101 100(L)  CO2 22 - 32 mmol/L 27 26  Calcium 8.9 - 10.3 mg/dL 8.9 9.3  Total Protein 6.5 - 8.1 g/dL 6.3(L) -  Total Bilirubin 0.3 - 1.2 mg/dL 0.8 -  Alkaline Phos 38 - 126 U/L 58 -  AST 15 - 41 U/L 15 -  ALT 14 - 54 U/L 16 -    Lipid Panel     Component Value Date/Time   CHOL 145 08/04/2017 1344   TRIG 42 08/04/2017 1344   HDL 48 08/04/2017 1344   CHOLHDL 3.0 08/04/2017 1344   VLDL 8 08/04/2017 1344   LDLCALC 89 08/04/2017 1344    No components found for: NTPROBNP No results for input(s): PROBNP in the last 8760 hours. No results for input(s): TSH in the last 8760  hours.  BMP No results for input(s): NA, K, CL, CO2, GLUCOSE, BUN, CREATININE, CALCIUM, GFRNONAA, GFRAA in the last 8760 hours.  HEMOGLOBIN A1C Lab Results  Component Value Date   HGBA1C 5.5 08/03/2017   MPG 111.15 08/03/2017   External Labs: Collected: 04/23/2021 provided by the referring physician. Hemoglobin 13.3 g/dL, hematocrit 40.6%. Sodium 144, potassium 4.8, chloride 104, bicarb 26, BUN 17, creatinine 1. AST 31, ALT 54, alkaline phosphatase 97 (ALT above normal limits) Total cholesterol 145, triglycerides 89, HDL 50, LDL 78. TSH 1.59. Hemoglobin A1c 5.9  IMPRESSION:    ICD-10-CM   1. Heart murmur  R01.1 EKG 12-Lead    PCV ECHOCARDIOGRAM COMPLETE    2. History of rheumatic fever  Z86.79     3. Benign hypertension  I10     4. Mixed hyperlipidemia  E78.2     5. Prediabetes  R73.03     6. History of COVID-19  Z86.16     7. Former smoker  Z87.891     82. Class 2 obesity due to  excess calories without serious comorbidity with body mass index (BMI) of 39.0 to 39.9 in adult  E66.09    Z68.39        RECOMMENDATIONS: Dana Hamilton is a 60 y.o. female whose past medical history and cardiac risk factors include: Hypertension, hyperlipidemia, prediabetes, insomnia, general anxiety disorder, postmenopausal female, history of rheumatic fever, history of COVID-19 infection, obesity due to excess calories, former smoker.  Heart murmur Patient appears to have a diastolic murmur on physical examination and prior history rheumatic fever. Shared decision was to proceed with echocardiogram to evaluate for LVEF, structural heart disease.  Benign hypertension Office blood pressures within acceptable range. Given her comorbid conditions would recommend a systolic blood pressure goal to be less than or equal to 130 mmHg. Educated on the importance of a low-salt diet. Her antihypertensive medications were recently uptitrated by her PCP-very appropriate. Currently managed by primary care provider.  Mixed hyperlipidemia Currently on atorvastatin 40 mg p.o. nightly. Outside labs independently reviewed and noted above for further reference. Does not endorse myalgias.  Prediabetes Educated importance of glycemic control.  Currently managed by primary care provider.  Former smoker Educated on the importance of continued smoking cessation.  Class 2 obesity due to excess calories without serious comorbidity with body mass index (BMI) of 39.0 to 39.9 in adult Body mass index is 39.58 kg/m. I reviewed with the patient the importance of diet, regular physical activity/exercise, weight loss.   Patient is educated on increasing physical activity gradually as tolerated.  With the goal of moderate intensity exercise for 30 minutes a day 5 days a week.  As part of this consultation reviewed outside records provided by PCP including office note, EKG, and labs.  These were reviewed and  summarized findings noted above.  Discussed disease management and physical examination findings.  Order diagnostic testing further recommendations to follow.  FINAL MEDICATION LIST END OF ENCOUNTER: No orders of the defined types were placed in this encounter.   Medications Discontinued During This Encounter  Medication Reason   lisinopril (PRINIVIL,ZESTRIL) 5 MG tablet Change in therapy   atorvastatin (LIPITOR) 80 MG tablet Patient Preference     Current Outpatient Medications:    acetaminophen (TYLENOL) 325 MG tablet, Take 650 mg by mouth every 6 (six) hours as needed (for pain or headaches). , Disp: , Rfl:    atorvastatin (LIPITOR) 40 MG tablet, Take 40 mg by mouth at bedtime., Disp: , Rfl:  buPROPion (WELLBUTRIN XL) 300 MG 24 hr tablet, Take 300 mg by mouth daily., Disp: , Rfl:    Cholecalciferol (VITAMIN D3) 2000 units capsule, Take 2,000 Units by mouth daily., Disp: , Rfl:    escitalopram (LEXAPRO) 20 MG tablet, Take 20 mg by mouth daily., Disp: , Rfl:    ibuprofen (ADVIL,MOTRIN) 200 MG tablet, Take 400 mg by mouth every 6 (six) hours as needed (for pain or headaches)., Disp: , Rfl:    lisinopril-hydrochlorothiazide (ZESTORETIC) 20-25 MG tablet, Take 1 tablet by mouth daily., Disp: , Rfl:    Multiple Vitamins-Calcium (ONE-A-DAY WOMENS FORMULA PO), Take 1 tablet by mouth daily., Disp: , Rfl:   Orders Placed This Encounter  Procedures   EKG 12-Lead   PCV ECHOCARDIOGRAM COMPLETE    There are no Patient Instructions on file for this visit.   --Continue cardiac medications as reconciled in final medication list. --Return in about 4 weeks (around 07/28/2021) for Follow up, Echo. Or sooner if needed. --Continue follow-up with your primary care physician regarding the management of your other chronic comorbid conditions.  Patient's questions and concerns were addressed to her satisfaction. She voices understanding of the instructions provided during this encounter.   This note was  created using a voice recognition software as a result there may be grammatical errors inadvertently enclosed that do not reflect the nature of this encounter. Every attempt is made to correct such errors.  Rex Kras, [place], [facility]  Pager: [phone] Office: [phone]

## 2021-07-14 ENCOUNTER — Other Ambulatory Visit: Payer: Self-pay

## 2021-07-14 ENCOUNTER — Ambulatory Visit: Payer: BC Managed Care – PPO

## 2021-07-14 DIAGNOSIS — R011 Cardiac murmur, unspecified: Secondary | ICD-10-CM

## 2021-07-28 ENCOUNTER — Other Ambulatory Visit: Payer: Self-pay

## 2021-07-28 ENCOUNTER — Encounter: Payer: Self-pay | Admitting: Cardiology

## 2021-07-28 ENCOUNTER — Ambulatory Visit: Payer: BC Managed Care – PPO | Admitting: Cardiology

## 2021-07-28 VITALS — BP 141/76 | HR 75 | Resp 16 | Ht 66.0 in | Wt 241.6 lb

## 2021-07-28 DIAGNOSIS — E782 Mixed hyperlipidemia: Secondary | ICD-10-CM

## 2021-07-28 DIAGNOSIS — I10 Essential (primary) hypertension: Secondary | ICD-10-CM | POA: Diagnosis not present

## 2021-07-28 DIAGNOSIS — E6609 Other obesity due to excess calories: Secondary | ICD-10-CM

## 2021-07-28 DIAGNOSIS — Z8679 Personal history of other diseases of the circulatory system: Secondary | ICD-10-CM

## 2021-07-28 DIAGNOSIS — I061 Rheumatic aortic insufficiency: Secondary | ICD-10-CM | POA: Diagnosis not present

## 2021-07-28 DIAGNOSIS — Z87891 Personal history of nicotine dependence: Secondary | ICD-10-CM

## 2021-07-28 DIAGNOSIS — R7303 Prediabetes: Secondary | ICD-10-CM

## 2021-07-28 DIAGNOSIS — Z8616 Personal history of COVID-19: Secondary | ICD-10-CM

## 2021-07-28 NOTE — Progress Notes (Signed)
Date:  07/28/2021   ID:  Dana Hamilton, DOB 1960-12-06, MRN [id number]  PCP:  Fanny Bien, MD  Cardiologist:  Rex Kras, DO, [facility] (established care 06/30/2021)  Date: 07/28/21 Last Office Visit: 06/30/2021   Chief Complaint  Patient presents with   Results   Follow-up    HPI  Dana Hamilton is a 60 y.o. female who presents to the office with a chief complaint of " Review test results." Patient's past medical history and cardiovascular risk factors include: Aortic regurgitation, hypertension, hyperlipidemia, prediabetes, insomnia, general anxiety disorder, postmenopausal female, history of rheumatic fever, history of COVID-19 infection, obesity due to excess calories, former smoker.  She is referred to the office at the request of Fanny Bien, MD for evaluation of cardiac murmur.  Patient is known to have a history of rheumatic fever at the age of 58 and used to have serial EKGs while growing up.  She was treated prophylactically with penicillin for approximately 20 years.  During her recent visit with PCP she was noted to have a cardiac murmur and was referred to cardiology for further evaluation and management.  Since last office visit patient did undergo an echocardiogram which notes preserved LVEF with mild to moderate aortic regurgitation and mild mitral regurgitation.  I suspect that this is most likely secondary to her underlying rheumatic fever however it can also be related to age-related degeneration.  Clinically patient denies any chest pain or heart failure symptoms.  No hospitalizations or urgent care visits since last office encounter.  Patient has been more cognizant with regards to her blood pressure management.  She is been evaluating the amount of salt intake and is limiting her intake to around 2500 mg/day.  No family history of premature coronary artery disease or sudden cardiac death.  Patient states that mom has a history of atrial fibrillation and  underwent valve repair (most likely mitral).    FUNCTIONAL STATUS: Currently walking 30 minutes every day.   ALLERGIES: No Known Allergies  MEDICATION LIST PRIOR TO VISIT: Current Meds  Medication Sig   acetaminophen (TYLENOL) 325 MG tablet Take 650 mg by mouth every 6 (six) hours as needed (for pain or headaches).    atorvastatin (LIPITOR) 40 MG tablet Take 40 mg by mouth at bedtime.   buPROPion (WELLBUTRIN XL) 300 MG 24 hr tablet Take 300 mg by mouth daily.   Cholecalciferol (VITAMIN D3) 2000 units capsule Take 2,000 Units by mouth daily.   escitalopram (LEXAPRO) 20 MG tablet Take 20 mg by mouth daily.   ibuprofen (ADVIL,MOTRIN) 200 MG tablet Take 400 mg by mouth every 6 (six) hours as needed (for pain or headaches).   lisinopril-hydrochlorothiazide (ZESTORETIC) 20-25 MG tablet Take 1 tablet by mouth daily.   Multiple Vitamins-Calcium (ONE-A-DAY WOMENS FORMULA PO) Take 1 tablet by mouth daily.     PAST MEDICAL HISTORY: Past Medical History:  Diagnosis Date   Depression with anxiety    Headache    History of COVID-19    History of rheumatic fever    Hyperlipidemia    Hypertension    Prediabetes     PAST SURGICAL HISTORY: Past Surgical History:  Procedure Laterality Date   ANKLE FRACTURE SURGERY Right    TONSILLECTOMY      FAMILY HISTORY: The patient family history includes Atrial fibrillation in her mother; Colon cancer in her paternal grandmother; Heart disease in her maternal grandfather; Memory loss in her mother; Parkinson's disease in her father; Stroke in her paternal uncle; Transient  ischemic attack in her paternal grandfather.  SOCIAL HISTORY:  The patient  reports that she quit smoking about 29 years ago. Her smoking use included cigarettes. She has a 3.00 pack-year smoking history. She has never used smokeless tobacco. She reports current alcohol use. She reports that she does not currently use drugs after having used the following drugs: Marijuana.  REVIEW OF  SYSTEMS: Review of Systems  Constitutional: Negative for chills and fever.  HENT:  Negative for hoarse voice and nosebleeds.   Eyes:  Negative for discharge, double vision and pain.  Cardiovascular:  Negative for chest pain, claudication, dyspnea on exertion, leg swelling, near-syncope, orthopnea, palpitations, paroxysmal nocturnal dyspnea and syncope.  Respiratory:  Negative for hemoptysis and shortness of breath.   Musculoskeletal:  Negative for muscle cramps and myalgias.  Gastrointestinal:  Negative for abdominal pain, constipation, diarrhea, hematemesis, hematochezia, melena, nausea and vomiting.  Neurological:  Negative for dizziness and light-headedness.   PHYSICAL EXAM: Vitals with BMI 07/28/2021 06/30/2021 06/30/2021  Height 5\' 6"  - 5\' 6"   Weight 241 lbs 10 oz - 245 lbs 3 oz  BMI 78.93 - 81.0  Systolic 175 102 585  Diastolic 76 82 82  Pulse 75 66 76    CONSTITUTIONAL: Well-developed and well-nourished. No acute distress.  SKIN: Skin is warm and dry. No rash noted. No cyanosis. No pallor. No jaundice HEAD: Normocephalic and atraumatic.  EYES: No scleral icterus MOUTH/THROAT: Moist oral membranes.  NECK: No JVD present. No thyromegaly noted. No carotid bruits  LYMPHATIC: No visible cervical adenopathy.  CHEST Normal respiratory effort. No intercostal retractions  LUNGS: Clear to auscultation bilaterally.  No stridor. No wheezes. No rales.  CARDIOVASCULAR: Regular, positive I7-P8, diastolic murmur left sternal border, no gallops or rubs. ABDOMINAL: Obese, soft, nontender, nondistended, positive bowel sounds in all 4 quadrants no apparent ascites.  EXTREMITIES: No peripheral edema, 2+ DP and PT pulses. HEMATOLOGIC: No significant bruising NEUROLOGIC: Oriented to person, place, and time. Nonfocal. Normal muscle tone.  PSYCHIATRIC: Normal mood and affect. Normal behavior. Cooperative  CARDIAC DATABASE: EKG: 06/30/2021: Normal sinus rhythm, 63 bpm, RBBB, without underlying  ischemia or injury pattern.   Echocardiogram: 07/14/2021:  Normal LV systolic function with visual EF 60-65%. Left ventricle cavity is normal in size. Presence of a septal bulge. Normal global wall motion.  Normal diastolic filling pattern, normal LAP.  Aortic valve sclerosis without stenosis. Mild to moderate aortic regurgitation.  Mild (Grade I) mitral regurgitation.  Trace tricuspid regurgitation. No evidence of pulmonary hypertension.  No prior study for comparison.   Stress Testing: No results found for this or any previous visit from the past 1095 days.  Heart Catheterization: None  LABORATORY DATA: CBC Latest Ref Rng & Units 08/04/2017 08/03/2017  WBC 4.0 - 10.5 K/uL 12.4(H) 18.0(H)  Hemoglobin 12.0 - 15.0 g/dL 12.6 14.2  Hematocrit 36.0 - 46.0 % 38.4 42.8  Platelets 150 - 400 K/uL 290 316    CMP Latest Ref Rng & Units 08/04/2017 08/03/2017  Glucose 65 - 99 mg/dL 109(H) 109(H)  BUN 6 - 20 mg/dL 20 19  Creatinine 0.44 - 1.00 mg/dL 1.21(H) 1.17(H)  Sodium 135 - 145 mmol/L 137 135  Potassium 3.5 - 5.1 mmol/L 3.6 4.5  Chloride 101 - 111 mmol/L 101 100(L)  CO2 22 - 32 mmol/L 27 26  Calcium 8.9 - 10.3 mg/dL 8.9 9.3  Total Protein 6.5 - 8.1 g/dL 6.3(L) -  Total Bilirubin 0.3 - 1.2 mg/dL 0.8 -  Alkaline Phos 38 - 126 U/L 58 -  AST 15 - 41 U/L 15 -  ALT 14 - 54 U/L 16 -    Lipid Panel     Component Value Date/Time   CHOL 145 08/04/2017 1344   TRIG 42 08/04/2017 1344   HDL 48 08/04/2017 1344   CHOLHDL 3.0 08/04/2017 1344   VLDL 8 08/04/2017 1344   LDLCALC 89 08/04/2017 1344    No components found for: NTPROBNP No results for input(s): PROBNP in the last 8760 hours. No results for input(s): TSH in the last 8760 hours.  BMP No results for input(s): NA, K, CL, CO2, GLUCOSE, BUN, CREATININE, CALCIUM, GFRNONAA, GFRAA in the last 8760 hours.  HEMOGLOBIN A1C Lab Results  Component Value Date   HGBA1C 5.5 08/03/2017   MPG 111.15 08/03/2017   External  Labs: Collected: 04/23/2021 provided by the referring physician. Hemoglobin 13.3 g/dL, hematocrit 40.6%. Sodium 144, potassium 4.8, chloride 104, bicarb 26, BUN 17, creatinine 1. AST 31, ALT 54, alkaline phosphatase 97 (ALT above normal limits) Total cholesterol 145, triglycerides 89, HDL 50, LDL 78. TSH 1.59. 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 History of rheumatic fever  Z86.79     3. Benign hypertension  I10     4. Mixed hyperlipidemia  E78.2     5. Prediabetes  R73.03     6. History of COVID-19  Z86.16     7. Former smoker  Z87.891     71. Class 2 obesity due to excess calories without serious comorbidity with body mass index (BMI) of 39.0 to 39.9 in adult  E66.09    Z68.39        RECOMMENDATIONS: Dana Hamilton is a 60 y.o. female whose past medical history and cardiac risk factors include: Hypertension, hyperlipidemia, prediabetes, insomnia, general anxiety disorder, postmenopausal female, history of rheumatic fever, history of COVID-19 infection, obesity due to excess calories, former smoker.  Aortic regurgitation, rheumatic versus age-related Based on initial echo the severity is mild to moderate. Clinically patient is euvolemic and not in congestive heart failure. Would recommend an echocardiogram in 1 year to evaluate disease progression/delta change.  If the severity of aortic regurgitation remained stable would recommend follow-up echocardiograms in 3 to 5 years or change in clinical status.    Benign hypertension Given her comorbid conditions would recommend a systolic blood pressure goal to be less than or equal to 130 mmHg. Educated on the importance of a low-salt diet. Her antihypertensive medications were recently uptitrated by her PCP. Currently managed by primary care provider.  Mixed hyperlipidemia Currently on atorvastatin 40 mg p.o. nightly. Outside labs  independently reviewed and noted above for further reference. Does not endorse myalgias.  Prediabetes Educated importance of glycemic control.  Currently managed by primary care provider.   Class 2 obesity due to excess calories without serious comorbidity with body mass index (BMI) of 39.0 to 39.9 in adult Body mass index is 39 kg/m. I reviewed with the patient the importance of diet, regular physical activity/exercise, weight loss.   Patient is educated on increasing physical activity gradually as tolerated.  With the goal of moderate intensity exercise for 30 minutes a day 5 days a week.   FINAL MEDICATION LIST END OF ENCOUNTER: No orders of the defined types were placed in this encounter.   There are no discontinued medications.    Current Outpatient Medications:    acetaminophen (TYLENOL) 325 MG tablet, Take 650  mg by mouth every 6 (six) hours as needed (for pain or headaches). , Disp: , Rfl:    atorvastatin (LIPITOR) 40 MG tablet, Take 40 mg by mouth at bedtime., Disp: , Rfl:    buPROPion (WELLBUTRIN XL) 300 MG 24 hr tablet, Take 300 mg by mouth daily., Disp: , Rfl:    Cholecalciferol (VITAMIN D3) 2000 units capsule, Take 2,000 Units by mouth daily., Disp: , Rfl:    escitalopram (LEXAPRO) 20 MG tablet, Take 20 mg by mouth daily., Disp: , Rfl:    ibuprofen (ADVIL,MOTRIN) 200 MG tablet, Take 400 mg by mouth every 6 (six) hours as needed (for pain or headaches)., Disp: , Rfl:    lisinopril-hydrochlorothiazide (ZESTORETIC) 20-25 MG tablet, Take 1 tablet by mouth daily., Disp: , Rfl:    Multiple Vitamins-Calcium (ONE-A-DAY WOMENS FORMULA PO), Take 1 tablet by mouth daily., Disp: , Rfl:   Orders Placed This Encounter  Procedures   PCV ECHOCARDIOGRAM COMPLETE     There are no Patient Instructions on file for this visit.   --Continue cardiac medications as reconciled in final medication list. --Return in about 13 months (around 08/15/2022) for Review test results. Or sooner if  needed. --Continue follow-up with your primary care physician regarding the management of your other chronic comorbid conditions.  Patient's questions and concerns were addressed to her satisfaction. She voices understanding of the instructions provided during this encounter.   This note was created using a voice recognition software as a result there may be grammatical errors inadvertently enclosed that do not reflect the nature of this encounter. Every attempt is made to correct such errors.  Rex Kras, [place], [facility]  Pager: [phone] Office: [phone]

## 2021-08-11 DIAGNOSIS — F331 Major depressive disorder, recurrent, moderate: Secondary | ICD-10-CM | POA: Diagnosis not present

## 2021-08-11 DIAGNOSIS — I061 Rheumatic aortic insufficiency: Secondary | ICD-10-CM | POA: Diagnosis not present

## 2021-08-11 DIAGNOSIS — G4700 Insomnia, unspecified: Secondary | ICD-10-CM | POA: Diagnosis not present

## 2021-08-11 DIAGNOSIS — F411 Generalized anxiety disorder: Secondary | ICD-10-CM | POA: Diagnosis not present

## 2021-11-16 DIAGNOSIS — H2513 Age-related nuclear cataract, bilateral: Secondary | ICD-10-CM | POA: Diagnosis not present

## 2021-11-16 DIAGNOSIS — H04123 Dry eye syndrome of bilateral lacrimal glands: Secondary | ICD-10-CM | POA: Diagnosis not present

## 2021-11-16 DIAGNOSIS — H43811 Vitreous degeneration, right eye: Secondary | ICD-10-CM | POA: Diagnosis not present

## 2021-11-16 DIAGNOSIS — H5213 Myopia, bilateral: Secondary | ICD-10-CM | POA: Diagnosis not present

## 2022-04-28 DIAGNOSIS — Z114 Encounter for screening for human immunodeficiency virus [HIV]: Secondary | ICD-10-CM | POA: Diagnosis not present

## 2022-04-28 DIAGNOSIS — Z0000 Encounter for general adult medical examination without abnormal findings: Secondary | ICD-10-CM | POA: Diagnosis not present

## 2022-04-28 DIAGNOSIS — Z13220 Encounter for screening for lipoid disorders: Secondary | ICD-10-CM | POA: Diagnosis not present

## 2022-05-12 DIAGNOSIS — H6121 Impacted cerumen, right ear: Secondary | ICD-10-CM | POA: Diagnosis not present

## 2022-05-12 DIAGNOSIS — B359 Dermatophytosis, unspecified: Secondary | ICD-10-CM | POA: Diagnosis not present

## 2022-05-12 DIAGNOSIS — Z1211 Encounter for screening for malignant neoplasm of colon: Secondary | ICD-10-CM | POA: Diagnosis not present

## 2022-05-12 DIAGNOSIS — Z0000 Encounter for general adult medical examination without abnormal findings: Secondary | ICD-10-CM | POA: Diagnosis not present

## 2022-05-13 ENCOUNTER — Other Ambulatory Visit: Payer: Self-pay | Admitting: Family Medicine

## 2022-05-13 DIAGNOSIS — Z1231 Encounter for screening mammogram for malignant neoplasm of breast: Secondary | ICD-10-CM

## 2022-05-19 DIAGNOSIS — H6121 Impacted cerumen, right ear: Secondary | ICD-10-CM | POA: Diagnosis not present

## 2022-05-19 DIAGNOSIS — H6091 Unspecified otitis externa, right ear: Secondary | ICD-10-CM | POA: Diagnosis not present

## 2022-06-07 ENCOUNTER — Ambulatory Visit
Admission: RE | Admit: 2022-06-07 | Discharge: 2022-06-07 | Disposition: A | Discharge status: Home / Self Care | Payer: BC Managed Care – PPO | Source: Ambulatory Visit | Attending: Family Medicine | Admitting: Family Medicine

## 2022-06-07 DIAGNOSIS — Z1231 Encounter for screening mammogram for malignant neoplasm of breast: Secondary | ICD-10-CM

## 2022-07-11 ENCOUNTER — Encounter: Payer: Self-pay | Admitting: Gastroenterology

## 2022-07-11 ENCOUNTER — Other Ambulatory Visit: Payer: BC Managed Care – PPO

## 2022-07-12 ENCOUNTER — Ambulatory Visit: Payer: BC Managed Care – PPO

## 2022-07-12 DIAGNOSIS — I061 Rheumatic aortic insufficiency: Secondary | ICD-10-CM | POA: Diagnosis not present

## 2022-07-12 DIAGNOSIS — I10 Essential (primary) hypertension: Secondary | ICD-10-CM | POA: Diagnosis not present

## 2022-07-21 NOTE — Progress Notes (Signed)
LVM for patient to return call. 

## 2022-07-22 NOTE — Progress Notes (Signed)
LVM for patient to return call , to discuss Echo results

## 2022-07-25 ENCOUNTER — Telehealth: Payer: Self-pay

## 2022-07-25 NOTE — Telephone Encounter (Signed)
Spoke with patient and gave her results from Echo and reminded her of upcoming appointment scheduled for 07/27/2022 at 10:15am

## 2022-07-27 ENCOUNTER — Ambulatory Visit: Payer: BC Managed Care – PPO | Admitting: Cardiology

## 2022-07-27 ENCOUNTER — Encounter: Payer: Self-pay | Admitting: Cardiology

## 2022-07-27 VITALS — BP 120/72 | HR 76 | Temp 97.6°F | Resp 16 | Ht 66.0 in | Wt 224.0 lb

## 2022-07-27 DIAGNOSIS — Z8679 Personal history of other diseases of the circulatory system: Secondary | ICD-10-CM

## 2022-07-27 DIAGNOSIS — I061 Rheumatic aortic insufficiency: Secondary | ICD-10-CM

## 2022-07-27 DIAGNOSIS — E782 Mixed hyperlipidemia: Secondary | ICD-10-CM | POA: Diagnosis not present

## 2022-07-27 DIAGNOSIS — I10 Essential (primary) hypertension: Secondary | ICD-10-CM

## 2022-07-27 DIAGNOSIS — E6609 Other obesity due to excess calories: Secondary | ICD-10-CM

## 2022-07-27 DIAGNOSIS — Z87891 Personal history of nicotine dependence: Secondary | ICD-10-CM | POA: Diagnosis not present

## 2022-07-27 NOTE — Progress Notes (Signed)
ID:  Dana Hamilton, DOB [birth year], MRN [id number]  PCP:  Fanny Bien, MD  Cardiologist:  Rex Kras, DO, [facility], [place] (established care 06/30/2021)  Date: 07/27/22 Last Office Visit: 07/28/2022   Chief Complaint  Patient presents with   Follow-up    1 year follow-up, review echo results, discuss AR    HPI  Dana Hamilton is a 61 y.o. female whose past medical history and cardiovascular risk factors include: Aortic regurgitation, hypertension, hyperlipidemia, prediabetes, insomnia, general anxiety disorder, postmenopausal female, history of rheumatic fever, history of COVID-19 infection, obesity due to excess calories, former smoker.  During her prior well visits the patient was noted to have a cardiac murmur on physical examination and was referred to cardiology for further evaluation and management.  Initial echocardiogram noted preserved LVEF with mild to moderate aortic regurgitation and mild mitral regurgitation.  She now presents for 1 year follow-up visit. Overall remains asymptomatic from a cardiovascular standpoint.  Has lost 30 pounds since last office visit due to lifestyle changes for which she is congratulated for today's office visit.  She complains of a cough likely due to lisinopril.  Has an upcoming office visit with PCP.  She exercises at least 30 minutes a day and walks up to 5 miles per day.  Results of the echocardiogram reviewed with her in detail at today's office visit.  Overall severity of aortic regurgitation remains relatively stable.  No family history of premature coronary artery disease or sudden cardiac death.  Patient states that mom has a history of atrial fibrillation and underwent valve repair (most likely mitral).    FUNCTIONAL STATUS: Currently walking 5 miles per day.  minutes every day.   ALLERGIES: No Known Allergies  MEDICATION LIST PRIOR TO VISIT: Current Meds  Medication Sig   atorvastatin (LIPITOR) 40 MG tablet Take 40 mg by mouth at  bedtime.   buPROPion (WELLBUTRIN XL) 300 MG 24 hr tablet Take 300 mg by mouth daily.   Cholecalciferol (VITAMIN D3) 2000 units capsule Take 2,000 Units by mouth daily.   escitalopram (LEXAPRO) 20 MG tablet Take 20 mg by mouth daily.   ibuprofen (ADVIL,MOTRIN) 200 MG tablet Take 400 mg by mouth every 6 (six) hours as needed (for pain or headaches).   lisinopril-hydrochlorothiazide (ZESTORETIC) 20-25 MG tablet Take 1 tablet by mouth daily.   Multiple Vitamins-Calcium (ONE-A-DAY WOMENS FORMULA PO) Take 1 tablet by mouth daily.     PAST MEDICAL HISTORY: Past Medical History:  Diagnosis Date   Depression with anxiety    Headache    History of COVID-19    History of rheumatic fever    Hyperlipidemia    Hypertension    Prediabetes     PAST SURGICAL HISTORY: Past Surgical History:  Procedure Laterality Date   ANKLE FRACTURE SURGERY Right    TONSILLECTOMY      FAMILY HISTORY: The patient family history includes Atrial fibrillation in her mother; Colon cancer in her paternal grandmother; Heart disease in her maternal grandfather; Memory loss in her mother; Parkinson's disease in her father; Stroke in her paternal uncle; Transient ischemic attack in her paternal grandfather.  SOCIAL HISTORY:  The patient  reports that she quit smoking about 30 years ago. Her smoking use included cigarettes. She has a 3.00 pack-year smoking history. She has never used smokeless tobacco. She reports current alcohol use. She reports that she does not currently use drugs after having used the following drugs: Marijuana.  REVIEW OF SYSTEMS: Review of Systems  Constitutional: Negative  for chills and fever.  HENT:  Negative for hoarse voice and nosebleeds.   Eyes:  Negative for discharge, double vision and pain.  Cardiovascular:  Negative for chest pain, claudication, dyspnea on exertion, leg swelling, near-syncope, orthopnea, palpitations, paroxysmal nocturnal dyspnea and syncope.  Respiratory:  Negative for  hemoptysis and shortness of breath.   Musculoskeletal:  Negative for muscle cramps and myalgias.  Gastrointestinal:  Negative for abdominal pain, constipation, diarrhea, hematemesis, hematochezia, melena, nausea and vomiting.  Neurological:  Negative for dizziness and light-headedness.    PHYSICAL EXAM:    07/27/2022   10:15 AM 07/28/2021   11:40 AM 06/30/2021   11:26 AM  Vitals with BMI  Height '5\' 6"'$  '5\' 6"'$    Weight 224 lbs 241 lbs 10 oz   BMI 48.18 56.31   Systolic 497 026 378  Diastolic 72 76 82  Pulse 76 75 66   Physical Exam  Constitutional: No distress.  Age appropriate, hemodynamically stable.   Neck: No JVD present.  Cardiovascular: Normal rate, regular rhythm, S1 normal, S2 normal, intact distal pulses and normal pulses. Exam reveals no gallop, no S3 and no S4.  Murmur heard. Decrescendo early diastolic murmur is present with a grade of 3/4 at the upper right sternal border radiating to the apex. Pulses:      Dorsalis pedis pulses are 2+ on the right side and 2+ on the left side.       Posterior tibial pulses are 2+ on the right side and 2+ on the left side.  Pulmonary/Chest: Effort normal and breath sounds normal. No stridor. She has no wheezes. She has no rales.  Abdominal: Soft. Bowel sounds are normal. She exhibits no distension. There is no abdominal tenderness.  Musculoskeletal:        General: No edema.     Cervical back: Neck supple.  Neurological: She is alert and oriented to person, place, and time. She has intact cranial nerves (2-12).  Skin: Skin is warm and moist.   CARDIAC DATABASE: EKG: 07/27/2022: Normal sinus rhythm, 73 bpm, incomplete right bundle branch, without underlying ischemia or injury pattern  Echocardiogram: 07/12/2022: Normal LV systolic function with visual EF 60-65%. Left ventricle cavity is normal in size. Mild concentric hypertrophy of the left ventricle. Normal global wall motion. Doppler evidence of grade I (impaired) diastolic  dysfunction, normal LAP.  Trileaflet aortic valve. Mild to moderate aortic regurgitation. Sclerosis of the aortic valve. Structurally normal mitral valve. Mild (Grade I) mitral regurgitation. E-wave dominant mitral inflow. Structurally normal tricuspid valve with trace regurgitation. No evidence of pulmonary hypertension. No significant change compared to 2022.  Stress Testing: No results found for this or any previous visit from the past 1095 days.  Heart Catheterization: None  LABORATORY DATA:    Latest Ref Rng & Units 08/04/2017    2:52 AM 08/03/2017    4:12 PM  CBC  WBC 4.0 - 10.5 K/uL 12.4  18.0   Hemoglobin 12.0 - 15.0 g/dL 12.6  14.2   Hematocrit 36.0 - 46.0 % 38.4  42.8   Platelets 150 - 400 K/uL 290  316        Latest Ref Rng & Units 08/04/2017    2:52 AM 08/03/2017    4:12 PM  CMP  Glucose 65 - 99 mg/dL 109  109   BUN 6 - 20 mg/dL 20  19   Creatinine 0.44 - 1.00 mg/dL 1.21  1.17   Sodium 135 - 145 mmol/L 137  135   Potassium  3.5 - 5.1 mmol/L 3.6  4.5   Chloride 101 - 111 mmol/L 101  100   CO2 22 - 32 mmol/L 27  26   Calcium 8.9 - 10.3 mg/dL 8.9  9.3   Total Protein 6.5 - 8.1 g/dL 6.3    Total Bilirubin 0.3 - 1.2 mg/dL 0.8    Alkaline Phos 38 - 126 U/L 58    AST 15 - 41 U/L 15    ALT 14 - 54 U/L 16      Lipid Panel     Component Value Date/Time   CHOL 145 08/04/2017 1344   TRIG 42 08/04/2017 1344   HDL 48 08/04/2017 1344   CHOLHDL 3.0 08/04/2017 1344   VLDL 8 08/04/2017 1344   LDLCALC 89 08/04/2017 1344    No components found for: "NTPROBNP" No results for input(s): "PROBNP" in the last 8760 hours. No results for input(s): "TSH" in the last 8760 hours.  BMP No results for input(s): "NA", "K", "CL", "CO2", "GLUCOSE", "BUN", "CREATININE", "CALCIUM", "GFRNONAA", "GFRAA" in the last 8760 hours.  HEMOGLOBIN A1C Lab Results  Component Value Date   HGBA1C 5.5 08/03/2017   MPG 111.15 08/03/2017   External Labs: Collected: 04/23/2021 provided by the  referring physician. Hemoglobin 13.3 g/dL, hematocrit 40.6%. Sodium 144, potassium 4.8, chloride 104, bicarb 26, BUN 17, creatinine 1. AST 31, ALT 54, alkaline phosphatase 97 (ALT above normal limits) Total cholesterol 145, triglycerides 89, HDL 50, LDL 78. TSH 1.59. Hemoglobin A1c 5.9  IMPRESSION:    ICD-10-CM   1. 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 History of rheumatic fever  Z86.79     3. Benign hypertension  I10     4. Mixed hyperlipidemia  E78.2     5. Former smoker  Z87.891     13. Class 2 obesity due to excess calories without serious comorbidity with body mass index (BMI) of 36.0 to 36.9 in adult  E66.09    Z68.36        RECOMMENDATIONS: Kiela Shisler is a 61 y.o. female whose past medical history and cardiac risk factors include: Hypertension, hyperlipidemia, prediabetes, insomnia, general anxiety disorder, postmenopausal female, history of rheumatic fever, history of COVID-19 infection, obesity due to excess calories, former smoker.  Rheumatic aortic valve insufficiency / History of rheumatic fever 1 year follow-up echo illustrates severe aortic regurgitation to be similar to prior studies. Clinically asymptomatic. We emphasized the importance of blood pressure management & lifestyle modifications.  Monitor for now  Benign hypertension Office blood pressures well controlled. No changes warranted at this time. Currently managed by primary care provider.  Mixed hyperlipidemia Currently on atorvastatin.   She denies myalgia or other side effects. Currently managed by primary care provider.  Class 2 obesity due to excess calories without serious comorbidity with body mass index (BMI) of 36.0 to 36.9 in adult Last year her BMI was 39 and now Body mass index is 36.15 kg/m. She is congratulated on her efforts and success through her weight loss journey. I reviewed with the patient the importance of diet, regular physical  activity/exercise, weight loss.   Patient is educated on increasing physical activity gradually as tolerated.  With the goal of moderate intensity exercise for 30 minutes a day 5 days a week.  I will see her back on an annual basis.  In the meantime refer her back to her PCP as scheduled for management of her other chronic comorbid conditions and age-appropriate screening.  FINAL  MEDICATION LIST END OF ENCOUNTER: No orders of the defined types were placed in this encounter.   Medications Discontinued During This Encounter  Medication Reason   acetaminophen (TYLENOL) 325 MG tablet       Current Outpatient Medications:    atorvastatin (LIPITOR) 40 MG tablet, Take 40 mg by mouth at bedtime., Disp: , Rfl:    buPROPion (WELLBUTRIN XL) 300 MG 24 hr tablet, Take 300 mg by mouth daily., Disp: , Rfl:    Cholecalciferol (VITAMIN D3) 2000 units capsule, Take 2,000 Units by mouth daily., Disp: , Rfl:    escitalopram (LEXAPRO) 20 MG tablet, Take 20 mg by mouth daily., Disp: , Rfl:    ibuprofen (ADVIL,MOTRIN) 200 MG tablet, Take 400 mg by mouth every 6 (six) hours as needed (for pain or headaches)., Disp: , Rfl:    lisinopril-hydrochlorothiazide (ZESTORETIC) 20-25 MG tablet, Take 1 tablet by mouth daily., Disp: , Rfl:    Multiple Vitamins-Calcium (ONE-A-DAY WOMENS FORMULA PO), Take 1 tablet by mouth daily., Disp: , Rfl:   Orders Placed This Encounter  Procedures   EKG 12-Lead     There are no Patient Instructions on file for this visit.   --Continue cardiac medications as reconciled in final medication list. --Return in about 1 year (around 07/28/2023) for Follow up [place]. . Or sooner if needed. --Continue follow-up with your primary care physician regarding the management of your other chronic comorbid conditions.  Patient's questions and concerns were addressed to her satisfaction. She voices understanding of the instructions provided during this encounter.   This note was created using a voice  recognition software as a result there may be grammatical errors inadvertently enclosed that do not reflect the nature of this encounter. Every attempt is made to correct such errors.  Rex Kras, [place], [facility]  Pager: [phone] Office: [phone]

## 2022-08-08 ENCOUNTER — Ambulatory Visit (AMBULATORY_SURGERY_CENTER): Payer: Self-pay

## 2022-08-08 VITALS — Ht 66.0 in | Wt 224.0 lb

## 2022-08-08 DIAGNOSIS — R7303 Prediabetes: Secondary | ICD-10-CM | POA: Diagnosis not present

## 2022-08-08 DIAGNOSIS — Z1211 Encounter for screening for malignant neoplasm of colon: Secondary | ICD-10-CM

## 2022-08-08 MED ORDER — NA SULFATE-K SULFATE-MG SULF 17.5-3.13-1.6 GM/177ML PO SOLN
1.0000 | ORAL | 0 refills | Status: DC
Start: 2022-08-08 — End: 2022-08-25

## 2022-08-08 NOTE — Progress Notes (Signed)
No egg or soy allergy known to patient  No issues known to pt with past sedation with any surgeries or procedures Patient denies ever being told they had issues or difficulty with intubation  No FH of Malignant Hyperthermia Pt is not on diet pills Pt is not on  home 02  Pt is not on blood thinners  Pt denies issues with constipation  No A fib or A flutter Have any cardiac testing pending--denied Pt instructed to use [email or web address] or GoodRx for a price reduction on prep   

## 2022-08-10 DIAGNOSIS — E1165 Type 2 diabetes mellitus with hyperglycemia: Secondary | ICD-10-CM | POA: Diagnosis not present

## 2022-08-10 DIAGNOSIS — I10 Essential (primary) hypertension: Secondary | ICD-10-CM | POA: Diagnosis not present

## 2022-08-10 DIAGNOSIS — F411 Generalized anxiety disorder: Secondary | ICD-10-CM | POA: Diagnosis not present

## 2022-08-10 DIAGNOSIS — Z23 Encounter for immunization: Secondary | ICD-10-CM | POA: Diagnosis not present

## 2022-08-10 DIAGNOSIS — G4700 Insomnia, unspecified: Secondary | ICD-10-CM | POA: Diagnosis not present

## 2022-08-16 DIAGNOSIS — Z23 Encounter for immunization: Secondary | ICD-10-CM | POA: Diagnosis not present

## 2022-08-17 ENCOUNTER — Encounter: Payer: Self-pay | Admitting: Gastroenterology

## 2022-08-25 ENCOUNTER — Ambulatory Visit (AMBULATORY_SURGERY_CENTER): Payer: BC Managed Care – PPO | Admitting: Gastroenterology

## 2022-08-25 ENCOUNTER — Encounter: Payer: Self-pay | Admitting: Gastroenterology

## 2022-08-25 VITALS — BP 91/64 | HR 63 | Temp 98.0°F | Resp 11 | Ht 66.0 in | Wt 224.0 lb

## 2022-08-25 DIAGNOSIS — Z1211 Encounter for screening for malignant neoplasm of colon: Secondary | ICD-10-CM | POA: Diagnosis not present

## 2022-08-25 DIAGNOSIS — D122 Benign neoplasm of ascending colon: Secondary | ICD-10-CM

## 2022-08-25 DIAGNOSIS — K635 Polyp of colon: Secondary | ICD-10-CM

## 2022-08-25 MED ORDER — SODIUM CHLORIDE 0.9 % IV SOLN: 500.0000 mL | Freq: Once | INTRAVENOUS | Status: DC

## 2022-08-25 NOTE — Progress Notes (Signed)
[facility] History and Physical   Primary Care Physician:  Fanny Bien, MD   Reason for Procedure:   Colon cancer screening  Plan:    Screening colonoscopy     HPI: Mckyla Deckman is a 61 y.o. female undergoing initial average risk screening colonoscopy.  She has no family history of colon cancer (other than grandmother) and no chronic GI symptoms. Her brother was recently found to have polyps and is under close surveillance (details unknown).  She had a normal colonoscopy in [place] 10 years ago.   Past Medical History:  Diagnosis Date   Depression with anxiety    Headache    Heart murmur    History of COVID-19    History of rheumatic fever    Hyperlipidemia    Hypertension    Prediabetes     Past Surgical History:  Procedure Laterality Date   ANKLE FRACTURE SURGERY Right    TONSILLECTOMY      Prior to Admission medications   Medication Sig Start Date End Date Taking? Authorizing Provider  buPROPion (WELLBUTRIN XL) 300 MG 24 hr tablet Take 300 mg by mouth daily. 01/11/17  Yes [provider]  Cholecalciferol (VITAMIN D3) 2000 units capsule Take 2,000 Units by mouth daily.   Yes [provider]  diphenhydrAMINE (BENADRYL) 25 mg capsule Take 25 mg by mouth every 6 (six) hours as needed.   Yes [provider]  escitalopram (LEXAPRO) 20 MG tablet Take 20 mg by mouth daily. 04/28/21  Yes [provider]  rosuvastatin (CRESTOR) 5 MG tablet Take 5 mg by mouth at bedtime. 08/10/22  Yes [provider]  valsartan (DIOVAN) 160 MG tablet Take 160 mg by mouth daily. 08/10/22  Yes [provider]  ibuprofen (ADVIL,MOTRIN) 200 MG tablet Take 400 mg by mouth every 6 (six) hours as needed (for pain or headaches).    [provider]  Multiple Vitamins-Calcium (ONE-A-DAY WOMENS FORMULA PO) Take 1 tablet by mouth daily. Patient not taking: Reported on 08/08/2022    [provider]    Current  Outpatient Medications  Medication Sig Dispense Refill   buPROPion (WELLBUTRIN XL) 300 MG 24 hr tablet Take 300 mg by mouth daily.     Cholecalciferol (VITAMIN D3) 2000 units capsule Take 2,000 Units by mouth daily.     diphenhydrAMINE (BENADRYL) 25 mg capsule Take 25 mg by mouth every 6 (six) hours as needed.     escitalopram (LEXAPRO) 20 MG tablet Take 20 mg by mouth daily.     rosuvastatin (CRESTOR) 5 MG tablet Take 5 mg by mouth at bedtime.     valsartan (DIOVAN) 160 MG tablet Take 160 mg by mouth daily.     ibuprofen (ADVIL,MOTRIN) 200 MG tablet Take 400 mg by mouth every 6 (six) hours as needed (for pain or headaches).     Multiple Vitamins-Calcium (ONE-A-DAY WOMENS FORMULA PO) Take 1 tablet by mouth daily. (Patient not taking: Reported on 08/08/2022)     Current Facility-Administered Medications  Medication Dose Route Frequency Provider Last Rate Last Admin   0.9 %  sodium chloride infusion  500 mL Intravenous Once Daryel November, MD        Allergies as of 08/25/2022   (No Known Allergies)    Family History  Problem Relation Age of Onset   Atrial fibrillation Mother    Memory loss Mother    Parkinson's disease Father    Colon polyps Brother    Stroke Paternal Uncle  Heart disease Maternal Grandfather    Colon cancer Paternal Grandmother    Transient ischemic attack Paternal Grandfather    Esophageal cancer Neg Hx    Stomach cancer Neg Hx    Rectal cancer Neg Hx     Social History   Socioeconomic History   Marital status: Single    Spouse name: Not on file   Number of children: 1   Years of education: MBA   Highest education level: Not on file  Occupational History   Occupation: Publicist  Tobacco Use   Smoking status: Former    Packs/day: 1.00    Years: 3.00    Total pack years: 3.00    Types: Cigarettes    Quit date: 11/01/1991    Years since quitting: 30.8   Smokeless tobacco: Never  Vaping Use   Vaping Use: Never used  Substance and Sexual  Activity   Alcohol use: Yes    Comment: 1-2 glasses of wine per week   Drug use: Not Currently    Types: Marijuana    Comment: no use since 2010   Sexual activity: Not on file  Other Topics Concern   Not on file  Social History Narrative   Lives at home with her daughter.   Left-handed.   2 cups caffeine per day.   Social Determinants of Health   Financial Resource Strain: Not on file  Food Insecurity: Not on file  Transportation Needs: Not on file  Physical Activity: Not on file  Stress: Not on file  Social Connections: Not on file  Intimate Partner Violence: Not on file    Review of Systems:  All other review of systems negative except as mentioned in the HPI.  Physical Exam: Vital signs BP 130/72   Pulse 66   Temp 98 F (36.7 C)   Ht '5\' 6"'$  (1.676 m)   Wt 224 lb (101.6 kg)   SpO2 99%   BMI 36.15 kg/m   General:   Alert,  Well-developed, well-nourished, pleasant and cooperative in NAD Airway:  Mallampati 2 Lungs:  Clear throughout to auscultation.   Heart:  Regular rate and rhythm; no murmurs, clicks, rubs,  or gallops. Abdomen:  Soft, nontender and nondistended. Normal bowel sounds.   Neuro/Psych:  Normal mood and affect. A and O x 3   Aldrick Derrig E. Candis Schatz, MD [facility]

## 2022-08-25 NOTE — Patient Instructions (Signed)
Handout provided on polyps.   Continue present medications. Await pathology results.  Repeat colonoscopy (date not yet determined) for surveillance based on pathology results.   YOU HAD AN ENDOSCOPIC PROCEDURE TODAY AT [facility]:   Refer to the procedure report that was given to you for any specific questions about what was found during the examination.  If the procedure report does not answer your questions, please call your gastroenterologist to clarify.  If you requested that your care partner not be given the details of your procedure findings, then the procedure report has been included in a sealed envelope for you to review at your convenience later.  YOU SHOULD EXPECT: Some feelings of bloating in the abdomen. Passage of more gas than usual.  Walking can help get rid of the air that was put into your GI tract during the procedure and reduce the bloating. If you had a lower endoscopy (such as a colonoscopy or flexible sigmoidoscopy) you may notice spotting of blood in your stool or on the toilet paper. If you underwent a bowel prep for your procedure, you may not have a normal bowel movement for a few days.  Please Note:  You might notice some irritation and congestion in your nose or some drainage.  This is from the oxygen used during your procedure.  There is no need for concern and it should clear up in a day or so.  SYMPTOMS TO REPORT IMMEDIATELY:  Following lower endoscopy (colonoscopy or flexible sigmoidoscopy):  Excessive amounts of blood in the stool  Significant tenderness or worsening of abdominal pains  Swelling of the abdomen that is new, acute  Fever of 100F or higher  For urgent or emergent issues, a gastroenterologist can be reached at any hour by calling [phone]. Do not use MyChart messaging for urgent concerns.    DIET:  We do recommend a small meal at first, but then you may proceed to your regular diet.  Drink plenty of fluids but you should  avoid alcoholic beverages for 24 hours.  ACTIVITY:  You should plan to take it easy for the rest of today and you should NOT DRIVE or use heavy machinery until tomorrow (because of the sedation medicines used during the test).    FOLLOW UP: Our staff will call the number listed on your records the next business day following your procedure.  We will call around 7:15- 8:00 am to check on you and address any questions or concerns that you may have regarding the information given to you following your procedure. If we do not reach you, we will leave a message.     If any biopsies were taken you will be contacted by phone or by letter within the next 1-3 weeks.  Please call us at [phone] if you have not heard about the biopsies in 3 weeks.    SIGNATURES/CONFIDENTIALITY: You and/or your care partner have signed paperwork which will be entered into your electronic medical record.  These signatures attest to the fact that that the information above on your After Visit Summary has been reviewed and is understood.  Full responsibility of the confidentiality of this discharge information lies with you and/or your care-partner.

## 2022-08-25 NOTE — Progress Notes (Signed)
Sedate, gd SR, tolerated procedure well, VSS, report to RN 

## 2022-08-25 NOTE — Progress Notes (Signed)
Pt's states no medical or surgical changes since previsit or office visit. 

## 2022-08-25 NOTE — Progress Notes (Signed)
Called to room to assist during endoscopic procedure.  Patient ID and intended procedure confirmed with present staff. Received instructions for my participation in the procedure from the performing physician.  

## 2022-08-25 NOTE — Op Note (Signed)
[facility] Patient Name: Dana Hamilton Procedure Date: 08/25/2022 8:05 AM MRN: [id number] Endoscopist: Nicki Reaper E. Candis Schatz , MD, [id number] Age: 61 Referring MD:  Date of Birth: Aug 16, 1961 Gender: Female Account #: [id number] Procedure:                Colonoscopy Indications:              Screening for colorectal malignant neoplasm (last                            colonoscopy was 10 years ago) Medicines:                Monitored Anesthesia Care Procedure:                Pre-Anesthesia Assessment:                           - Prior to the procedure, a History and Physical                            was performed, and patient medications and                            allergies were reviewed. The patient's tolerance of                            previous anesthesia was also reviewed. The risks                            and benefits of the procedure and the sedation                            options and risks were discussed with the patient.                            All questions were answered, and informed consent                            was obtained. Prior Anticoagulants: The patient has                            taken no anticoagulant or antiplatelet agents. ASA                            Grade Assessment: II - A patient with mild systemic                            disease. After reviewing the risks and benefits,                            the patient was deemed in satisfactory condition to                            undergo the procedure.  After obtaining informed consent, the colonoscope                            was passed under direct vision. Throughout the                            procedure, the patient's blood pressure, pulse, and                            oxygen saturations were monitored continuously. The                            Colonoscope was introduced through the anus and                            advanced to the the  terminal ileum, with                            identification of the appendiceal orifice and IC                            valve. The colonoscopy was performed without                            difficulty. The patient tolerated the procedure                            well. The quality of the bowel preparation was                            adequate. The terminal ileum, ileocecal valve,                            appendiceal orifice, and rectum were photographed.                            The bowel preparation used was SUPREP via split                            dose instruction. Scope In: 8:09:23 AM Scope Out: 8:26:42 AM Scope Withdrawal Time: 0 hours 11 minutes 24 seconds  Total Procedure Duration: 0 hours 17 minutes 19 seconds  Findings:                 The perianal and digital rectal examinations were                            normal. Pertinent negatives include normal                            sphincter tone and no palpable rectal lesions.                           A 3 mm polyp was found in the ascending colon. The  polyp was sessile. The polyp was removed with a                            cold snare. Resection and retrieval were complete.                            Estimated blood loss was minimal.                           The exam was otherwise normal throughout the                            examined colon.                           The terminal ileum appeared normal.                           The retroflexed view of the distal rectum and anal                            verge was normal and showed no anal or rectal                            abnormalities. Complications:            No immediate complications. Estimated Blood Loss:     Estimated blood loss was minimal. Impression:               - One 3 mm polyp in the ascending colon, removed                            with a cold snare. Resected and retrieved.                           - The  examined portion of the ileum was normal.                           - The distal rectum and anal verge are normal on                            retroflexion view. Recommendation:           - Patient has a contact number available for                            emergencies. The signs and symptoms of potential                            delayed complications were discussed with the                            patient. Return to normal activities tomorrow.  Written discharge instructions were provided to the                            patient.                           - Resume previous diet.                           - Continue present medications.                           - Await pathology results.                           - Repeat colonoscopy (date not yet determined) for                            surveillance based on pathology results. Kathline Banbury E. Candis Schatz, MD 08/25/2022 8:30:33 AM This report has been signed electronically.

## 2022-08-26 ENCOUNTER — Telehealth: Payer: Self-pay

## 2022-08-26 NOTE — Telephone Encounter (Signed)
Attempted to reach patient for post-procedure f/u call. No answer. Left message for her to please not hesitate to call us if she has any questions/concerns regarding her care. 

## 2022-09-01 ENCOUNTER — Encounter: Payer: Self-pay | Admitting: Gastroenterology

## 2022-09-14 DIAGNOSIS — I10 Essential (primary) hypertension: Secondary | ICD-10-CM | POA: Diagnosis not present

## 2022-09-14 DIAGNOSIS — E782 Mixed hyperlipidemia: Secondary | ICD-10-CM | POA: Diagnosis not present

## 2022-09-14 DIAGNOSIS — E663 Overweight: Secondary | ICD-10-CM | POA: Diagnosis not present

## 2022-09-21 DIAGNOSIS — G4700 Insomnia, unspecified: Secondary | ICD-10-CM | POA: Diagnosis not present

## 2022-09-21 DIAGNOSIS — I10 Essential (primary) hypertension: Secondary | ICD-10-CM | POA: Diagnosis not present

## 2022-09-21 DIAGNOSIS — E559 Vitamin D deficiency, unspecified: Secondary | ICD-10-CM | POA: Diagnosis not present

## 2022-09-21 DIAGNOSIS — E782 Mixed hyperlipidemia: Secondary | ICD-10-CM | POA: Diagnosis not present

## 2022-10-11 DIAGNOSIS — F411 Generalized anxiety disorder: Secondary | ICD-10-CM | POA: Diagnosis not present

## 2022-10-11 DIAGNOSIS — G4700 Insomnia, unspecified: Secondary | ICD-10-CM | POA: Diagnosis not present

## 2022-10-11 DIAGNOSIS — I10 Essential (primary) hypertension: Secondary | ICD-10-CM | POA: Diagnosis not present

## 2022-10-11 DIAGNOSIS — F331 Major depressive disorder, recurrent, moderate: Secondary | ICD-10-CM | POA: Diagnosis not present

## 2022-10-17 DIAGNOSIS — U071 COVID-19: Secondary | ICD-10-CM | POA: Diagnosis not present

## 2022-11-14 DIAGNOSIS — U099 Post covid-19 condition, unspecified: Secondary | ICD-10-CM | POA: Diagnosis not present

## 2022-11-15 ENCOUNTER — Other Ambulatory Visit: Payer: Self-pay | Admitting: Family Medicine

## 2022-11-15 ENCOUNTER — Ambulatory Visit
Admission: RE | Admit: 2022-11-15 | Discharge: 2022-11-15 | Disposition: A | Discharge status: Home / Self Care | Payer: BC Managed Care – PPO | Source: Ambulatory Visit | Attending: Family Medicine | Admitting: Family Medicine

## 2022-11-15 DIAGNOSIS — J189 Pneumonia, unspecified organism: Secondary | ICD-10-CM

## 2022-11-15 DIAGNOSIS — R059 Cough, unspecified: Secondary | ICD-10-CM | POA: Diagnosis not present

## 2022-11-21 DIAGNOSIS — I10 Essential (primary) hypertension: Secondary | ICD-10-CM | POA: Diagnosis not present

## 2022-11-21 DIAGNOSIS — R5383 Other fatigue: Secondary | ICD-10-CM | POA: Diagnosis not present

## 2022-11-22 DIAGNOSIS — H50811 Duane's syndrome, right eye: Secondary | ICD-10-CM | POA: Diagnosis not present

## 2022-11-22 DIAGNOSIS — H2513 Age-related nuclear cataract, bilateral: Secondary | ICD-10-CM | POA: Diagnosis not present

## 2022-11-22 DIAGNOSIS — H52203 Unspecified astigmatism, bilateral: Secondary | ICD-10-CM | POA: Diagnosis not present

## 2022-11-22 DIAGNOSIS — H50812 Duane's syndrome, left eye: Secondary | ICD-10-CM | POA: Diagnosis not present

## 2022-11-22 DIAGNOSIS — H04123 Dry eye syndrome of bilateral lacrimal glands: Secondary | ICD-10-CM | POA: Diagnosis not present

## 2022-11-22 DIAGNOSIS — H524 Presbyopia: Secondary | ICD-10-CM | POA: Diagnosis not present

## 2022-11-22 DIAGNOSIS — H5213 Myopia, bilateral: Secondary | ICD-10-CM | POA: Diagnosis not present

## 2022-11-29 DIAGNOSIS — I10 Essential (primary) hypertension: Secondary | ICD-10-CM | POA: Diagnosis not present

## 2022-11-29 DIAGNOSIS — G4700 Insomnia, unspecified: Secondary | ICD-10-CM | POA: Diagnosis not present

## 2022-11-29 DIAGNOSIS — F411 Generalized anxiety disorder: Secondary | ICD-10-CM | POA: Diagnosis not present

## 2022-11-29 DIAGNOSIS — F331 Major depressive disorder, recurrent, moderate: Secondary | ICD-10-CM | POA: Diagnosis not present

## 2022-12-15 DIAGNOSIS — G4700 Insomnia, unspecified: Secondary | ICD-10-CM | POA: Diagnosis not present

## 2022-12-15 DIAGNOSIS — U099 Post covid-19 condition, unspecified: Secondary | ICD-10-CM | POA: Diagnosis not present

## 2022-12-15 DIAGNOSIS — I061 Rheumatic aortic insufficiency: Secondary | ICD-10-CM | POA: Diagnosis not present

## 2022-12-15 DIAGNOSIS — E559 Vitamin D deficiency, unspecified: Secondary | ICD-10-CM | POA: Diagnosis not present

## 2022-12-19 ENCOUNTER — Ambulatory Visit
Admission: RE | Admit: 2022-12-19 | Discharge: 2022-12-19 | Disposition: A | Discharge status: Home / Self Care | Payer: BC Managed Care – PPO | Source: Ambulatory Visit | Attending: Nurse Practitioner | Admitting: Nurse Practitioner

## 2022-12-19 ENCOUNTER — Other Ambulatory Visit: Payer: Self-pay | Admitting: Nurse Practitioner

## 2022-12-19 DIAGNOSIS — W19XXXA Unspecified fall, initial encounter: Secondary | ICD-10-CM | POA: Diagnosis not present

## 2022-12-19 DIAGNOSIS — M79644 Pain in right finger(s): Secondary | ICD-10-CM | POA: Diagnosis not present

## 2022-12-19 DIAGNOSIS — S62521A Displaced fracture of distal phalanx of right thumb, initial encounter for closed fracture: Secondary | ICD-10-CM | POA: Diagnosis not present

## 2022-12-19 DIAGNOSIS — M19041 Primary osteoarthritis, right hand: Secondary | ICD-10-CM | POA: Diagnosis not present

## 2022-12-27 DIAGNOSIS — M25562 Pain in left knee: Secondary | ICD-10-CM | POA: Diagnosis not present

## 2022-12-27 DIAGNOSIS — S62521A Displaced fracture of distal phalanx of right thumb, initial encounter for closed fracture: Secondary | ICD-10-CM | POA: Diagnosis not present

## 2022-12-27 DIAGNOSIS — U099 Post covid-19 condition, unspecified: Secondary | ICD-10-CM | POA: Diagnosis not present

## 2022-12-27 DIAGNOSIS — M25561 Pain in right knee: Secondary | ICD-10-CM | POA: Diagnosis not present

## 2022-12-28 DIAGNOSIS — M79641 Pain in right hand: Secondary | ICD-10-CM | POA: Diagnosis not present

## 2022-12-30 ENCOUNTER — Other Ambulatory Visit: Payer: Self-pay | Admitting: Nurse Practitioner

## 2022-12-30 ENCOUNTER — Ambulatory Visit
Admission: RE | Admit: 2022-12-30 | Discharge: 2022-12-30 | Disposition: A | Discharge status: Home / Self Care | Payer: BC Managed Care – PPO | Source: Ambulatory Visit | Attending: Nurse Practitioner | Admitting: Nurse Practitioner

## 2022-12-30 DIAGNOSIS — M25561 Pain in right knee: Secondary | ICD-10-CM

## 2022-12-30 DIAGNOSIS — M25462 Effusion, left knee: Secondary | ICD-10-CM | POA: Diagnosis not present

## 2022-12-30 DIAGNOSIS — M1712 Unilateral primary osteoarthritis, left knee: Secondary | ICD-10-CM | POA: Diagnosis not present

## 2022-12-30 DIAGNOSIS — M25562 Pain in left knee: Secondary | ICD-10-CM

## 2023-01-06 DIAGNOSIS — U099 Post covid-19 condition, unspecified: Secondary | ICD-10-CM | POA: Diagnosis not present

## 2023-01-06 DIAGNOSIS — G9332 Myalgic encephalomyelitis/chronic fatigue syndrome: Secondary | ICD-10-CM | POA: Diagnosis not present

## 2023-01-06 DIAGNOSIS — M25561 Pain in right knee: Secondary | ICD-10-CM | POA: Diagnosis not present

## 2023-01-06 DIAGNOSIS — R4189 Other symptoms and signs involving cognitive functions and awareness: Secondary | ICD-10-CM | POA: Diagnosis not present

## 2023-01-09 DIAGNOSIS — I10 Essential (primary) hypertension: Secondary | ICD-10-CM | POA: Diagnosis not present

## 2023-01-09 DIAGNOSIS — U099 Post covid-19 condition, unspecified: Secondary | ICD-10-CM | POA: Diagnosis not present

## 2023-01-09 DIAGNOSIS — G4700 Insomnia, unspecified: Secondary | ICD-10-CM | POA: Diagnosis not present

## 2023-01-09 DIAGNOSIS — M170 Bilateral primary osteoarthritis of knee: Secondary | ICD-10-CM | POA: Diagnosis not present

## 2023-01-12 ENCOUNTER — Ambulatory Visit: Payer: BC Managed Care – PPO | Attending: Family Medicine | Admitting: Physical Therapy

## 2023-01-12 ENCOUNTER — Encounter: Payer: Self-pay | Admitting: Physical Therapy

## 2023-01-12 ENCOUNTER — Other Ambulatory Visit: Payer: Self-pay

## 2023-01-12 DIAGNOSIS — M25662 Stiffness of left knee, not elsewhere classified: Secondary | ICD-10-CM

## 2023-01-12 DIAGNOSIS — M170 Bilateral primary osteoarthritis of knee: Secondary | ICD-10-CM | POA: Insufficient documentation

## 2023-01-12 DIAGNOSIS — M25561 Pain in right knee: Secondary | ICD-10-CM | POA: Diagnosis not present

## 2023-01-12 DIAGNOSIS — R262 Difficulty in walking, not elsewhere classified: Secondary | ICD-10-CM

## 2023-01-12 DIAGNOSIS — M25562 Pain in left knee: Secondary | ICD-10-CM | POA: Insufficient documentation

## 2023-01-12 DIAGNOSIS — M6281 Muscle weakness (generalized): Secondary | ICD-10-CM

## 2023-01-12 DIAGNOSIS — M25661 Stiffness of right knee, not elsewhere classified: Secondary | ICD-10-CM

## 2023-01-12 NOTE — Patient Instructions (Signed)
     [facility] Welcome to [facility]! Here you will find all the information you will need regarding your pool therapy. If you have further questions at any time, please call our office at [phone]. After completing your initial evaluation in the [facility], you may be eligible to complete a portion of your therapy in the pool. A typical week of therapy will consist of 1-2 typical physical therapy visits at our [facility] location and an additional session of therapy in the pool located at the [facility] at [address]. [address], [place] [address]. The phone number at the pool site is [phone]. Please call this number if you are running late or need to cancel your appointment.  Aquatic therapy will be offered on Wednesday mornings and Friday afternoons. Each session will last approximately 45 minutes. All scheduling and payments for aquatic therapy sessions, including cancelations, will be done through our [facility] location.  To be eligible for aquatic therapy, these criteria must be met: You must be able to independently change in the locker room and get to the pool deck. A caregiver can come with you to help if needed. There are benches for a caregiver to sit on next to the pool. No one with an open wound is permitted in the pool.  Handicap parking is available in the front and there is a drop off option for even closer accessibility. Please arrive 15 minutes prior to your appointment to prepare for your pool session. You must sign in at the front desk upon your arrival. Please be sure to attend to any toileting needs prior to entering the pool. Locker rooms for changing are available.  There is direct access to the pool deck from the locker room. You can lock your belongings in a locker or bring them with you poolside. Your therapist will greet you on the pool deck. There may be other swimmers in the pool at the  same time but your session is one-on-one with the therapist.   

## 2023-01-12 NOTE — Therapy (Signed)
OUTPATIENT PHYSICAL THERAPY LOWER EXTREMITY EVALUATION   Patient Name: Dana Hamilton MRN: [id number] DOB:July 27, 1961, 62 y.o., female Today's Date: 01/12/2023  END OF SESSION:  PT End of Session - 01/12/23 1139     Visit Number 1    Date for PT Re-Evaluation 03/09/23    Authorization Type BCBS    PT Start Time 1142    PT Stop Time 1230    PT Time Calculation (min) 48 min    Activity Tolerance Patient tolerated treatment well             Past Medical History:  Diagnosis Date   Depression with anxiety    Headache    Heart murmur    History of COVID-19    History of rheumatic fever    Hyperlipidemia    Hypertension    Prediabetes    Past Surgical History:  Procedure Laterality Date   ANKLE FRACTURE SURGERY Right    TONSILLECTOMY     Patient Active Problem List   Diagnosis Date Noted   Chest pain 08/03/2017   Leukocytosis 08/03/2017   Tachycardia 08/03/2017   Hyperglycemia 08/03/2017   Lateral rectus muscle paralysis 05/24/2017   Chronic migraine 05/23/2017    PCP: Rachell Cipro MD  REFERRING PROVIDER: Everette Rank FNP  REFERRING DIAG: OA bil knees   THERAPY DIAG:  Knee stiffness; weakness; difficulty in walking  Rationale for Evaluation and Treatment: Rehabilitation  ONSET DATE: December 2023  SUBJECTIVE:   SUBJECTIVE STATEMENT: Covid in mid December and now with long covid symptoms; inflammation has worsened OA of knees.  I'm having balance issues too.  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 Yes NPRS scale: 3-4/10 now;  7-8 before steroid injections Pain location: right knee worse; left knee worsened from fall:  medial knee pain bil  Aggravating factors: walking, up and down stairs one at a time (nervous about stairs especially down) occassional buckling sensation; standing Relieving factors: diclofenac gel; heat pain   PRECAUTIONS: Fall  WEIGHT BEARING RESTRICTIONS: No  FALLS:  Has patient fallen in last 6 months? Yes. 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; OA of thumb  PATIENT SURVEYS:  LEFS 41/80  COGNITION: Overall cognitive status: Within functional limits for tasks assessed       MUSCLE LENGTH: Hamstrings: Right 70 deg; Left 70 deg  POSTURE: right knee varus   LOWER EXTREMITY ROM: right 3-125 degrees; left 0-125 degrees; able to do 1/3 squat Unable to single leg stand at all on right LE;  left SLS < 3 sec  LOWER EXTREMITY MMT: Able to rise sit to stand without UE assist;  able to do SLR with mild right quad lag  MMT Right eval Left eval  Hip flexion 5 5  Hip extension    Hip abduction 4+ 4+  Hip adduction    Hip internal rotation    Hip external rotation    Knee flexion 4 4  Knee extension 4- 4-  Ankle dorsiflexion    Ankle plantarflexion    Ankle inversion    Ankle eversion     (Blank rows = not tested)   FUNCTIONAL TESTS:  5x sit to stand without UE assist: 14.03 sec TUG 7.26 sec  GAIT: Comments: 3 min walk test 518 feet with HR 60, O2 99% pain rating 3/10  severe varus right knee    TODAY'S TREATMENT:                                                                                                                               DATE: 3/14    PATIENT EDUCATION:  Education details: Educated patient on anatomy and physiology of current symptoms, prognosis, plan of care as well as initial self care strategies to promote recovery Person educated: Patient Education method: Explanation Education comprehension: verbalized understanding  HOME EXERCISE PROGRAM: Access Code: [email or web address] URL: [email or web address] Date: 01/12/2023 Prepared by: Ruben Im  Exercises - Sidelying Hip Abduction  - 1 x daily - 7 x weekly - 1 sets - 10 reps - Seated Long Arc Quad  - 1 x daily - 7 x weekly - 1 sets - 10 reps - Sit to Stand  - 1 x daily - 7 x weekly - 1 sets - 10 reps  ASSESSMENT:  CLINICAL IMPRESSION: Patient is a 62 y.o. female who was seen today for physical therapy evaluation and treatment for OA of the knees. The patient demonstrates decreased knee flexion and extension range of motion.  Strength deficits include decreased quad strength and gluteal strength bilaterally.  Varus deformity noted on right.  She is limited in gait tolerance especially since diagnosed with long covid.  Recent knee injections have helped keep pain level at 3-4/10 rather than 7-8/10.   These deficits and pain cause functional impairments with standing, walking, going up and down curbs and steps at home and in the community.  She has been unable to work but home to return part-time soon.   OBJECTIVE IMPAIRMENTS: decreased activity tolerance, difficulty walking, decreased ROM, decreased strength, impaired perceived functional ability, and pain.   ACTIVITY LIMITATIONS: standing, squatting, stairs, and locomotion level  PARTICIPATION LIMITATIONS: meal prep, cleaning, community activity, occupation, and yard work  PERSONAL FACTORS: 1 comorbidity: long covid  are also affecting patient's functional outcome.   REHAB  POTENTIAL: Good  CLINICAL DECISION MAKING: Stable/uncomplicated  EVALUATION COMPLEXITY: Low   GOALS: Goals reviewed with patient? Yes  SHORT TERM GOALS: Target date: 02/09/2023   The patient will demonstrate knowledge of basic self care strategies and exercises to promote healing   Baseline: Goal status: INITIAL  2.  The patient will report a 30% improvement in pain levels with functional activities which are currently difficult including walking, standing, and stairs Baseline:  Goal status: INITIAL  3.  Bil knee flexion ROM improved to 130 degrees needed for greater ease with stairs to access office Baseline:  Goal status: INITIAL  4.  Improved quad strength to 4/5 needed for greater ease with stairs and decrease risk of falls Baseline:  Goal status: INITIAL    LONG TERM GOALS: Target date: 03/09/2023    The patient will be independent in a safe self progression of a home exercise program to promote further recovery of function  Baseline:  Goal status: INITIAL  2.  The patient will report a 75% improvement in pain levels with functional activities which are currently difficult including walking, standing and stairs Baseline:  Goal status: INITIAL  3.  5x sit to stand improved to 12.5 sec indicating improved quad strength to 4+/5 Baseline:  Goal status: INITIAL  4.  The patient will have improved gait stamina and speed needed to ambulate 800 feet in 6 minutes  Baseline:  Goal status: INITIAL  5.  LEFS outcome score improved to  51/80    indicating improved function with less pain Baseline:  Goal status: INITIAL    PLAN:  PT FREQUENCY: 2x/week  PT DURATION: 8 weeks  PLANNED INTERVENTIONS: Therapeutic exercises, Therapeutic activity, Neuromuscular re-education, Balance training, Gait training, Patient/Family education, Self Care, Joint mobilization, Aquatic Therapy, Dry Needling, Electrical stimulation, Cryotherapy, Moist heat, Taping, Vasopneumatic device,  Ultrasound, Ionotophoresis '4mg'$ /ml Dexamethasone, Manual therapy, and Re-evaluation  PLAN FOR NEXT SESSION: aquatic PT secondary to sensitivity and pain with weight bearing;  LE strengthening, knee ROM, proprioception, balance; begin with open chain and progress to closed chain as tolerated; monitor for excessive fatigue secondary to long covid symptoms  Ruben Im, PT 01/12/23 8:20 PM Phone: [phone] Fax: [phone]

## 2023-01-16 DIAGNOSIS — M170 Bilateral primary osteoarthritis of knee: Secondary | ICD-10-CM | POA: Diagnosis not present

## 2023-01-17 ENCOUNTER — Ambulatory Visit: Payer: BC Managed Care – PPO | Admitting: Physical Therapy

## 2023-01-17 DIAGNOSIS — M25562 Pain in left knee: Secondary | ICD-10-CM | POA: Diagnosis not present

## 2023-01-17 DIAGNOSIS — M25561 Pain in right knee: Secondary | ICD-10-CM | POA: Diagnosis not present

## 2023-01-17 DIAGNOSIS — R262 Difficulty in walking, not elsewhere classified: Secondary | ICD-10-CM

## 2023-01-17 DIAGNOSIS — M170 Bilateral primary osteoarthritis of knee: Secondary | ICD-10-CM | POA: Diagnosis not present

## 2023-01-17 DIAGNOSIS — M25662 Stiffness of left knee, not elsewhere classified: Secondary | ICD-10-CM

## 2023-01-17 DIAGNOSIS — U099 Post covid-19 condition, unspecified: Secondary | ICD-10-CM | POA: Diagnosis not present

## 2023-01-17 DIAGNOSIS — M25661 Stiffness of right knee, not elsewhere classified: Secondary | ICD-10-CM

## 2023-01-17 DIAGNOSIS — M6281 Muscle weakness (generalized): Secondary | ICD-10-CM

## 2023-01-17 NOTE — Therapy (Signed)
OUTPATIENT PHYSICAL THERAPY LOWER EXTREMITY PROGRESS NOTE  Patient Name: Dana Hamilton MRN: [id number] DOB:April 27, 1961, 62 y.o., female Today's Date: 01/17/2023  END OF SESSION:  PT End of Session - 01/17/23 1151     Visit Number 2    Date for PT Re-Evaluation 03/09/23    Authorization Type BCBS    PT Start Time 1146    PT Stop Time 1230    PT Time Calculation (min) 44 min    Activity Tolerance Patient tolerated treatment well             Past Medical History:  Diagnosis Date   Depression with anxiety    Headache    Heart murmur    History of COVID-19    History of rheumatic fever    Hyperlipidemia    Hypertension    Prediabetes    Past Surgical History:  Procedure Laterality Date   ANKLE FRACTURE SURGERY Right    TONSILLECTOMY     Patient Active Problem List   Diagnosis Date Noted   Chest pain 08/03/2017   Leukocytosis 08/03/2017   Tachycardia 08/03/2017   Hyperglycemia 08/03/2017   Lateral rectus muscle paralysis 05/24/2017   Chronic migraine 05/23/2017    PCP: Rachell Cipro MD  REFERRING PROVIDER: Everette Rank FNP  REFERRING DIAG: OA bil knees   THERAPY DIAG:  Knee stiffness; weakness; difficulty in walking  Rationale for Evaluation and Treatment: Rehabilitation  ONSET DATE: December 2023  SUBJECTIVE:   SUBJECTIVE STATEMENT:  Walked a lot in [place] for my [employer] (she passed in August) sore in left medial knee;  had first ortho-visc injection yesterday.   Next one on Monday.    Covid in mid December and now with long covid symptoms; inflammation has worsened OA of knees.  I'm having balance issues too.  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 Yes NPRS scale: 5/10 right when walking; 0 on left  Pain location: right knee worse; left knee worsened from fall:  medial knee pain bil  Aggravating factors: walking, up and down stairs one at a time (nervous about stairs especially down) occassional buckling sensation; standing Relieving factors: diclofenac gel; heat pain   PRECAUTIONS: Fall  WEIGHT BEARING RESTRICTIONS: No  FALLS:  Has patient fallen in last 6 months? Yes. 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; OA of thumb  PATIENT SURVEYS:  LEFS 41/80  COGNITION: Overall cognitive status: Within functional limits for tasks assessed       MUSCLE LENGTH: Hamstrings: Right 70 deg; Left 70 deg  POSTURE: right knee varus   LOWER EXTREMITY ROM: right 3-125 degrees;  left 0-125 degrees; able to do 1/3 squat Unable to single leg stand at all on right LE;  left SLS < 3 sec  LOWER EXTREMITY MMT: Able to rise sit to stand without UE assist; able to do SLR with mild right quad lag  MMT Right eval Left eval  Hip flexion 5 5  Hip extension    Hip abduction 4+ 4+  Hip adduction    Hip internal rotation    Hip external rotation    Knee flexion 4 4  Knee extension 4- 4-  Ankle dorsiflexion    Ankle plantarflexion    Ankle inversion    Ankle eversion     (Blank rows = not tested)   FUNCTIONAL TESTS:  5x sit to stand  without UE assist: 14.03 sec TUG 7.26 sec  GAIT: Comments: 3 min walk test 518 feet with HR 60, O2 99% pain rating 3/10  severe varus right knee    TODAY'S TREATMENT:                                                                                                                              DATE: 3/19 Review of initial HEP: reports sidelying hip abduction painful so substituted sidelying clams instead LAQ isometrics (pushing against other leg) 5 sec hold 10x Supine HS sets 5 sec hold 10x Bridge 10x SAQ 10x Seated knee flexion slides with towel on floor 10x Seated pushing feet into the floor to activate quads 10x Nu-step 5 min L1  Rating of perceived exertion 3 out of 10 end of session    PATIENT EDUCATION:  Education details: Educated patient on anatomy and physiology of current symptoms, prognosis, plan of care as well as initial self care strategies to promote recovery Person educated: Patient Education method: Explanation Education comprehension: verbalized understanding  HOME EXERCISE PROGRAM: Access Code: [email or web address] URL: [email or web address] Date: 01/17/2023 Prepared by: Ruben Im  Exercises - Seated Long Arc Quad  - 1 x daily - 7 x weekly - 1 sets - 10 reps - Sit to Stand  - 1 x daily - 7 x weekly - 1 sets - 10 reps - Clamshell  - 1 x daily - 7 x weekly - 1 sets - 10 reps - Supine Bilateral Hamstring Sets  - 1 x daily - 7 x weekly - 1 sets - 10 reps - 5 hold - Supine Short Arc Quad  - 1 x daily - 7 x weekly - 1 sets - 10 reps - 5 hold - Supine Bridge  - 1 x daily - 7 x weekly - 1 sets - 10 reps - Seated Active Assistive Knee Flexion and Extension  - 1 x daily - 7 x weekly - 1 sets - 10 reps - Seated Knee Flexion  - 1 x daily - 7 x weekly - 1 sets - 10 reps ASSESSMENT:  CLINICAL IMPRESSION: Patient's pain level and fatigue level (long Covid) closely monitored throughout session.  Moderate pain/irritability state therefore performed ex's in open chain  (non-loaded) positions.  Verbal and tactile cues to activate targeted muscle groups and limit compensatory strategies.  RPE end of session indicates treatment well tolerated.  OBJECTIVE IMPAIRMENTS: decreased activity tolerance, difficulty walking, decreased ROM, decreased strength, impaired perceived functional ability, and pain.   ACTIVITY LIMITATIONS: standing, squatting, stairs, and locomotion level  PARTICIPATION LIMITATIONS: meal prep, cleaning, community activity, occupation, and yard work  PERSONAL FACTORS: 1 comorbidity: long covid  are also affecting patient's functional outcome.   REHAB POTENTIAL: Good  CLINICAL DECISION MAKING: Stable/uncomplicated  EVALUATION COMPLEXITY: Low   GOALS: Goals reviewed with patient? Yes  SHORT TERM GOALS: Target date: 02/09/2023   The patient will demonstrate knowledge of basic self care strategies and exercises to promote healing   Baseline: Goal status: INITIAL  2.  The patient will report a 30% improvement in pain levels with functional activities which are currently difficult including walking, standing, and stairs Baseline:  Goal status: INITIAL  3.  Bil knee flexion ROM improved to 130 degrees needed for greater ease with stairs to access office Baseline:  Goal status: INITIAL  4.  Improved quad strength to 4/5 needed for greater ease with stairs and decrease risk of falls Baseline:  Goal status: INITIAL    LONG TERM GOALS: Target date: 03/09/2023    The patient will be independent in a safe self progression of a home exercise program to promote further recovery of function  Baseline:  Goal status: INITIAL  2.  The patient will report a 75% improvement in pain levels with functional activities which are currently difficult including walking, standing and stairs Baseline:  Goal status: INITIAL  3.  5x sit to stand improved to 12.5 sec indicating improved quad strength to 4+/5 Baseline:  Goal status: INITIAL  4.   The patient will have improved gait stamina and speed needed to ambulate 800 feet in 6 minutes  Baseline:  Goal status: INITIAL  5.  LEFS outcome score improved to  51/80    indicating improved function with less pain Baseline:  Goal status: INITIAL    PLAN:  PT FREQUENCY: 2x/week  PT DURATION: 8 weeks  PLANNED INTERVENTIONS: Therapeutic exercises, Therapeutic activity, Neuromuscular re-education, Balance training, Gait training, Patient/Family education, Self Care, Joint mobilization, Aquatic Therapy, Dry Needling, Electrical stimulation, Cryotherapy, Moist heat, Taping, Vasopneumatic device, Ultrasound, Ionotophoresis 4mg /ml Dexamethasone, Manual therapy, and Re-evaluation  PLAN FOR NEXT SESSION: aquatic PT secondary to sensitivity and pain with weight bearing;  LE strengthening, knee ROM, proprioception, balance; begin with open chain and progress to closed chain as tolerated; monitor for excessive fatigue secondary to long covid symptoms  Ruben Im, PT 01/17/23 8:39 PM Phone: [phone] Fax: [phone]

## 2023-01-19 ENCOUNTER — Ambulatory Visit (HOSPITAL_BASED_OUTPATIENT_CLINIC_OR_DEPARTMENT_OTHER): Payer: BC Managed Care – PPO | Attending: Family Medicine | Admitting: Physical Therapy

## 2023-01-19 ENCOUNTER — Encounter (HOSPITAL_BASED_OUTPATIENT_CLINIC_OR_DEPARTMENT_OTHER): Payer: Self-pay | Admitting: Physical Therapy

## 2023-01-19 DIAGNOSIS — M25661 Stiffness of right knee, not elsewhere classified: Secondary | ICD-10-CM

## 2023-01-19 DIAGNOSIS — M25561 Pain in right knee: Secondary | ICD-10-CM | POA: Insufficient documentation

## 2023-01-19 DIAGNOSIS — M25562 Pain in left knee: Secondary | ICD-10-CM | POA: Diagnosis not present

## 2023-01-19 DIAGNOSIS — M6281 Muscle weakness (generalized): Secondary | ICD-10-CM

## 2023-01-19 DIAGNOSIS — R262 Difficulty in walking, not elsewhere classified: Secondary | ICD-10-CM

## 2023-01-19 DIAGNOSIS — M170 Bilateral primary osteoarthritis of knee: Secondary | ICD-10-CM | POA: Diagnosis not present

## 2023-01-19 DIAGNOSIS — M25662 Stiffness of left knee, not elsewhere classified: Secondary | ICD-10-CM

## 2023-01-19 NOTE — Therapy (Signed)
OUTPATIENT PHYSICAL THERAPY LOWER EXTREMITY TREATMENT   Patient Name: Dana Hamilton MRN: [id number] DOB:24-Jul-1961, 62 y.o., female Today's Date: 01/19/2023  END OF SESSION:  PT End of Session - 01/19/23 1358     Visit Number 3    Date for PT Re-Evaluation 03/09/23    Authorization Type BCBS    PT Start Time 1400    PT Stop Time 1440    PT Time Calculation (min) 40 min             Past Medical History:  Diagnosis Date   Depression with anxiety    Headache    Heart murmur    History of COVID-19    History of rheumatic fever    Hyperlipidemia    Hypertension    Prediabetes    Past Surgical History:  Procedure Laterality Date   ANKLE FRACTURE SURGERY Right    TONSILLECTOMY     Patient Active Problem List   Diagnosis Date Noted   Chest pain 08/03/2017   Leukocytosis 08/03/2017   Tachycardia 08/03/2017   Hyperglycemia 08/03/2017   Lateral rectus muscle paralysis 05/24/2017   Chronic migraine 05/23/2017    PCP: Rachell Cipro MD  REFERRING PROVIDER: Everette Rank FNP  REFERRING DIAG: OA bil knees   THERAPY DIAG:  Knee stiffness; weakness; difficulty in walking  Rationale for Evaluation and Treatment: Rehabilitation  ONSET DATE: December 2023  SUBJECTIVE:   SUBJECTIVE STATEMENT:  Pt reports she returned to work today, 1/2 day. Knees remain painful.  She went to yoga class after last PT session and was "wiped out" afterwards.     FROM EVAL: Covid in mid December and now with long covid symptoms; inflammation has worsened OA of knees.  I'm having balance issues too.  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 Yes NPRS scale: 5/10 right when walking; 0 on left  Pain location: right knee worse; left knee worsened from fall:  medial knee pain bil  Aggravating factors: walking, up and down stairs one at a time (nervous about stairs especially down) occassional buckling sensation; standing Relieving factors: diclofenac gel; heat pain   PRECAUTIONS: Fall  WEIGHT BEARING RESTRICTIONS: No  FALLS:  Has patient fallen in last 6 months? Yes. 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; OA of thumb  PATIENT SURVEYS:  LEFS 41/80  COGNITION: Overall cognitive status: Within functional limits for tasks assessed       MUSCLE LENGTH: Hamstrings: Right 70 deg; Left 70 deg  POSTURE: right knee varus   LOWER EXTREMITY ROM: right 3-125 degrees; left 0-125 degrees; able to do 1/3 squat Unable to single  leg stand at all on right LE;  left SLS < 3 sec  LOWER EXTREMITY MMT: Able to rise sit to stand without UE assist; able to do SLR with mild right quad lag  MMT Right eval Left eval  Hip flexion 5 5  Hip extension    Hip abduction 4+ 4+  Hip adduction    Hip internal rotation    Hip external rotation    Knee flexion 4 4  Knee extension 4- 4-  Ankle dorsiflexion    Ankle plantarflexion    Ankle inversion    Ankle eversion     (Blank rows = not tested)   FUNCTIONAL TESTS:  5x sit to stand without UE assist: 14.03 sec TUG 7.26 sec  GAIT: Comments: 3 min walk  test 518 feet with HR 60, O2 99% pain rating 3/10  severe varus right knee    TODAY'S TREATMENT:                                                                                                                              DATE:  3/21 Pt seen for aquatic therapy today.  Treatment took place in water 3.5-4.75 ft in depth at the [facility] pool. Temp of water was 91.  Pt entered/exited the pool via stairs with step-to pattern with bilat rail.    * applied Tegaderm to Lt knee abrasion prior to entry into water   Intro to aquatic therapy Walking without support - forward/ backward, cues for relaxed upper traps, vertical trunk Side stepping 1 lap, with arm addct/abdct x1 lap; with added rainbow hand floats with addct x 1 lap With single/ bilat rainbow hand floats at side, under water x 3 laps total  Holding wall: heel raises x 10; hip abdct/ addct x 10 each; LE circles CW/CCW; hip opener/capt morgan STS at bench in water with feet on step with cues for forward weight shift x 5 x 2 Seated on bench:  cycling,  hip abdct/addct in long sitting, alternating LAQ with DF - 2 sets  At stairs:  R/L adductor stretch x 20s x 2; foot on 2nd step with forward lunge into hip flexor stretch x 2 reps of 20sec on RLE, 1 rep on LLE Pt requires the buoyancy and hydrostatic pressure of water for support, and to offload joints by unweighting joint load by at least 50 % in navel deep water and by at least 75-80% in chest to neck deep water.  Viscosity of the water is needed for resistance of strengthening. Water current perturbations provides challenge to standing balance requiring increased core activation.      3/19 Review of initial HEP: reports sidelying hip abduction painful so substituted sidelying clams instead LAQ isometrics (pushing against other leg) 5 sec hold 10x Supine HS sets 5 sec hold 10x Bridge 10x SAQ 10x Seated knee flexion slides with towel on floor 10x Seated pushing feet into the  floor to activate quads  10x Nu-step 5 min L1  Rating of perceived exertion 3 out of 10 end of session    PATIENT EDUCATION:  Education details: aquatic therapy intro Person educated: Patient Education method: Explanation Education comprehension: verbalized understanding  HOME EXERCISE PROGRAM: Access Code: [email or web address] URL: [email or web address] Date: 01/17/2023 Prepared by: Ruben Im  Exercises - Seated Long Arc Quad  - 1 x daily - 7 x weekly - 1 sets - 10 reps - Sit to Stand  - 1 x daily - 7 x weekly - 1 sets - 10 reps - Clamshell  - 1 x daily - 7 x weekly - 1 sets - 10 reps - Supine Bilateral Hamstring Sets  - 1 x daily - 7 x weekly - 1 sets - 10 reps - 5 hold - Supine Short Arc Quad  - 1 x daily - 7 x weekly - 1 sets - 10 reps - 5 hold - Supine Bridge  - 1 x daily - 7 x weekly - 1 sets - 10 reps - Seated Active Assistive Knee Flexion and Extension  - 1 x daily - 7 x weekly - 1 sets - 10 reps - Seated Knee Flexion  - 1 x daily - 7 x weekly - 1 sets - 10 reps ASSESSMENT:  CLINICAL IMPRESSION: Pt is confident in aquatic environment and able to take direction from therapist on land.  She worked at RPE of no greater than 4/10 during session.  No increase in knee pain, just complaints of tightness in Rt ant hip / groin tightness with exercises today. Will continue to progress sessions as tolerated.  Goals are ongoing.   OBJECTIVE IMPAIRMENTS: decreased activity tolerance, difficulty walking, decreased ROM, decreased strength, impaired perceived functional ability, and pain.   ACTIVITY LIMITATIONS: standing, squatting, stairs, and locomotion level  PARTICIPATION LIMITATIONS: meal prep, cleaning, community activity, occupation, and yard work  PERSONAL FACTORS: 1 comorbidity: long covid  are also affecting patient's functional outcome.   REHAB POTENTIAL: Good  CLINICAL DECISION MAKING: Stable/uncomplicated  EVALUATION COMPLEXITY: Low   GOALS: Goals reviewed  with patient? Yes  SHORT TERM GOALS: Target date: 02/09/2023   The patient will demonstrate knowledge of basic self care strategies and exercises to promote healing   Baseline: Goal status: INITIAL  2.  The patient will report a 30% improvement in pain levels with functional activities which are currently difficult including walking, standing, and stairs Baseline:  Goal status: INITIAL  3.  Bil knee flexion ROM improved to 130 degrees needed for greater ease with stairs to access office Baseline:  Goal status: INITIAL  4.  Improved quad strength to 4/5 needed for greater ease with stairs and decrease risk of falls Baseline:  Goal status: INITIAL    LONG TERM GOALS: Target date: 03/09/2023    The patient will be independent in a safe self progression of a home exercise program to promote further recovery of function  Baseline:  Goal status: INITIAL  2.  The patient will report a 75% improvement in pain levels with functional activities which are currently difficult including walking, standing and stairs Baseline:  Goal status: INITIAL  3.  5x sit to stand improved to 12.5 sec indicating improved quad strength to 4+/5 Baseline:  Goal status: INITIAL  4.  The patient will have improved gait stamina and speed needed to ambulate 800 feet in 6 minutes  Baseline:  Goal status: INITIAL  5.  LEFS outcome score improved to  51/80    indicating improved function with  less pain Baseline:  Goal status: INITIAL    PLAN:  PT FREQUENCY: 2x/week  PT DURATION: 8 weeks  PLANNED INTERVENTIONS: Therapeutic exercises, Therapeutic activity, Neuromuscular re-education, Balance training, Gait training, Patient/Family education, Self Care, Joint mobilization, Aquatic Therapy, Dry Needling, Electrical stimulation, Cryotherapy, Moist heat, Taping, Vasopneumatic device, Ultrasound, Ionotophoresis 4mg /ml Dexamethasone, Manual therapy, and Re-evaluation  PLAN FOR NEXT SESSION: aquatic PT  secondary to sensitivity and pain with weight bearing;  LE strengthening, knee ROM, proprioception, balance; begin with open chain and progress to closed chain as tolerated; monitor for excessive fatigue secondary to long covid symptoms  Kerin Perna, PTA 01/19/23 6:12 PM [place] [address] [place], [place], [address] Phone: [phone]   Fax:  [phone]

## 2023-01-23 ENCOUNTER — Encounter (HOSPITAL_BASED_OUTPATIENT_CLINIC_OR_DEPARTMENT_OTHER): Payer: Self-pay | Admitting: Physical Therapy

## 2023-01-23 ENCOUNTER — Ambulatory Visit (HOSPITAL_BASED_OUTPATIENT_CLINIC_OR_DEPARTMENT_OTHER): Payer: BC Managed Care – PPO | Admitting: Physical Therapy

## 2023-01-23 DIAGNOSIS — M6281 Muscle weakness (generalized): Secondary | ICD-10-CM

## 2023-01-23 DIAGNOSIS — M25561 Pain in right knee: Secondary | ICD-10-CM | POA: Diagnosis not present

## 2023-01-23 DIAGNOSIS — M25662 Stiffness of left knee, not elsewhere classified: Secondary | ICD-10-CM

## 2023-01-23 DIAGNOSIS — M25562 Pain in left knee: Secondary | ICD-10-CM | POA: Diagnosis not present

## 2023-01-23 DIAGNOSIS — M170 Bilateral primary osteoarthritis of knee: Secondary | ICD-10-CM | POA: Diagnosis not present

## 2023-01-23 DIAGNOSIS — M25661 Stiffness of right knee, not elsewhere classified: Secondary | ICD-10-CM

## 2023-01-23 DIAGNOSIS — R262 Difficulty in walking, not elsewhere classified: Secondary | ICD-10-CM

## 2023-01-23 NOTE — Therapy (Signed)
OUTPATIENT PHYSICAL THERAPY LOWER EXTREMITY TREATMENT   Patient Name: Dana Hamilton MRN: [id number] DOB:1961/02/28, 62 y.o., female Today's Date: 01/23/2023  END OF SESSION:  PT End of Session - 01/23/23 1036     Visit Number 4    Date for PT Re-Evaluation 03/09/23    Authorization Type BCBS    PT Start Time 1032    PT Stop Time 1112    PT Time Calculation (min) 40 min    Activity Tolerance Patient tolerated treatment well    Behavior During Therapy WFL for tasks assessed/performed             Past Medical History:  Diagnosis Date   Depression with anxiety    Headache    Heart murmur    History of COVID-19    History of rheumatic fever    Hyperlipidemia    Hypertension    Prediabetes    Past Surgical History:  Procedure Laterality Date   ANKLE FRACTURE SURGERY Right    TONSILLECTOMY     Patient Active Problem List   Diagnosis Date Noted   Chest pain 08/03/2017   Leukocytosis 08/03/2017   Tachycardia 08/03/2017   Hyperglycemia 08/03/2017   Lateral rectus muscle paralysis 05/24/2017   Chronic migraine 05/23/2017    PCP: Rachell Cipro MD  REFERRING PROVIDER: Everette Rank FNP  REFERRING DIAG: OA bil knees   THERAPY DIAG:  Knee stiffness; weakness; difficulty in walking  Rationale for Evaluation and Treatment: Rehabilitation  ONSET DATE: December 2023  SUBJECTIVE:   SUBJECTIVE STATEMENT:  Pt reports she did ok after last session, "a little tired afterwards" and took maybe a 15-70min nap that afternoon.   She states that she tried SLS on RLE at home, but it was painful.  FROM EVAL: Covid in mid December and now with long covid symptoms; inflammation has worsened OA of knees.  I'm having balance issues too.  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 Yes NPRS scale: 2/10 medial right knee   Pain location: right knee worse Aggravating factors: walking, up and down stairs one at a time (nervous about stairs especially down) occassional buckling sensation; standing Relieving factors: diclofenac gel; heat pain   PRECAUTIONS: Fall  WEIGHT BEARING RESTRICTIONS: No  FALLS:  Has patient fallen in last 6 months? Yes. 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; OA of thumb  PATIENT SURVEYS:  LEFS 41/80  COGNITION: Overall cognitive status: Within functional limits for tasks assessed       MUSCLE LENGTH: Hamstrings: Right 70 deg; Left 70 deg  POSTURE: right knee varus   LOWER EXTREMITY ROM: right 3-125  degrees; left 0-125 degrees; able to do 1/3 squat Unable to single leg stand at all on right LE;  left SLS < 3 sec  LOWER EXTREMITY MMT: Able to rise sit to stand without UE assist; able to do SLR with mild right quad lag  MMT Right eval Left eval  Hip flexion 5 5  Hip extension    Hip abduction 4+ 4+  Hip adduction    Hip internal rotation    Hip external rotation    Knee flexion 4 4  Knee extension 4- 4-  Ankle dorsiflexion    Ankle plantarflexion    Ankle inversion    Ankle eversion     (Blank rows = not tested)   FUNCTIONAL TESTS:  5x  sit to stand without UE assist: 14.03 sec TUG 7.26 sec  GAIT: Comments: 3 min walk test 518 feet with HR 60, O2 99% pain rating 3/10  severe varus right knee    TODAY'S TREATMENT:                                                                                                                              DATE:  3/25 Pt seen for aquatic therapy today.  Treatment took place in water 3.5-4.75 ft in depth at the [facility] pool. Temp of water was 91.  Pt entered/exited the pool via stairs with step-to pattern with bilat rail.    Walking without support - forward/ backward 3 laps;  Side stepping 1 lap, with arm addct/abdct x1 lap; with added rainbow hand floats with addct x 2 lap With bilat rainbow hand floats at side, under water x 3 laps total walking backward/forward High knee marching forward/backward with rainbow hand floats at surface Holding yellow hand floats:  SLS each LE x 15 sec; SLS with 3way toe tap x 10 each; heel raises x 10; hip abdct/ addct x 10 each; hip opener/capt morgan x 10 Holding wall: LE circles CW/CCW x 10 each  STS at bench in water with feet on step with cues for forward weight shift x 10 Seated on bench:  cycling,  hip abdct/addct in long sitting, alternating LAQ with DF - 2 sets  Quad stretch with foot on bench (opp foot on blue step) x 20s x 2 RLE, x 1 LLE At stairs:  foot on 2nd step with forward lunge into hip flexor stretch x 2 reps of 20sec on RLE, 1 rep on LLE Return to walking forward/ backwards  Pt requires the buoyancy and hydrostatic pressure of water for support, and to offload joints by unweighting joint load by at least 50 % in navel deep water and by at least 75-80% in chest to neck deep water.  Viscosity of the water is needed for resistance of strengthening. Water current perturbations provides challenge to standing balance requiring increased core activation.      Previous land 3/19: Review of initial HEP: reports sidelying hip  abduction painful so  substituted sidelying clams instead LAQ isometrics (pushing against other leg) 5 sec hold 10x Supine HS sets 5 sec hold 10x Bridge 10x SAQ 10x Seated knee flexion slides with towel on floor 10x Seated pushing feet into the floor to activate quads 10x Nu-step 5 min L1  Rating of perceived exertion 3 out of 10 end of session    PATIENT EDUCATION:  Education details: aquatic therapy intro Person educated: Patient Education method: Explanation Education comprehension: verbalized understanding  HOME EXERCISE PROGRAM: Access Code: [email or web address] URL: [email or web address] Date: 01/17/2023 Prepared by: Ruben Im  Exercises - Seated Long Arc Quad  - 1 x daily - 7 x weekly - 1 sets - 10 reps - Sit to Stand  - 1 x daily - 7 x weekly - 1 sets - 10 reps - Clamshell  - 1 x daily - 7 x weekly - 1 sets - 10 reps - Supine Bilateral Hamstring Sets  - 1 x daily - 7 x weekly - 1 sets - 10 reps - 5 hold - Supine Short Arc Quad  - 1 x daily - 7 x weekly - 1 sets - 10 reps - 5 hold - Supine Bridge  - 1 x daily - 7 x weekly - 1 sets - 10 reps - Seated Active Assistive Knee Flexion and Extension  - 1 x daily - 7 x weekly - 1 sets - 10 reps - Seated Knee Flexion  - 1 x daily - 7 x weekly - 1 sets - 10 reps ASSESSMENT:  CLINICAL IMPRESSION: Pt was able to complete increased quantity of exercises this session. She reported a RPE of no greater than 2/10 during session.  No increase in knee pain, just complaints of tightness in Rt ant hip / groin tightness with exercises today (less than last session). Will continue to progress sessions as tolerated.  Goals are ongoing.   OBJECTIVE IMPAIRMENTS: decreased activity tolerance, difficulty walking, decreased ROM, decreased strength, impaired perceived functional ability, and pain.   ACTIVITY LIMITATIONS: standing, squatting, stairs, and locomotion level  PARTICIPATION LIMITATIONS: meal prep, cleaning, community activity,  occupation, and yard work  PERSONAL FACTORS: 1 comorbidity: long covid  are also affecting patient's functional outcome.   REHAB POTENTIAL: Good  CLINICAL DECISION MAKING: Stable/uncomplicated  EVALUATION COMPLEXITY: Low   GOALS: Goals reviewed with patient? Yes  SHORT TERM GOALS: Target date: 02/09/2023   The patient will demonstrate knowledge of basic self care strategies and exercises to promote healing   Baseline: Goal status: INITIAL  2.  The patient will report a 30% improvement in pain levels with functional activities which are currently difficult including walking, standing, and stairs Baseline:  Goal status: INITIAL  3.  Bil knee flexion ROM improved to 130 degrees needed for greater ease with stairs to access office Baseline:  Goal status: INITIAL  4.  Improved quad strength to 4/5 needed for greater ease with stairs and decrease risk of falls Baseline:  Goal status: INITIAL    LONG TERM GOALS: Target date: 03/09/2023    The patient will be independent in a safe self progression of a home exercise program to promote further recovery of function  Baseline:  Goal status: INITIAL  2.  The patient will report a 75% improvement in pain levels with functional activities which are currently difficult including walking, standing and stairs Baseline:  Goal status: INITIAL  3.  5x sit to stand improved to 12.5 sec indicating improved quad strength to 4+/5 Baseline:  Goal status: INITIAL  4.  The patient will have improved gait stamina and speed needed to ambulate 800 feet in 6 minutes  Baseline:  Goal status: INITIAL  5.  LEFS outcome score improved to  51/80    indicating improved function with less pain Baseline:  Goal status: INITIAL    PLAN:  PT FREQUENCY: 2x/week  PT DURATION: 8 weeks  PLANNED INTERVENTIONS: Therapeutic exercises, Therapeutic activity, Neuromuscular re-education, Balance training, Gait training, Patient/Family education, Self  Care, Joint mobilization, Aquatic Therapy, Dry Needling, Electrical stimulation, Cryotherapy, Moist heat, Taping, Vasopneumatic device, Ultrasound, Ionotophoresis 4mg /ml Dexamethasone, Manual therapy, and Re-evaluation  PLAN FOR NEXT SESSION: aquatic PT secondary to sensitivity and pain with weight bearing;  LE strengthening, knee ROM, proprioception, balance; begin with open chain and progress to closed chain as tolerated; monitor for excessive fatigue secondary to long covid symptoms

## 2023-01-24 DIAGNOSIS — M170 Bilateral primary osteoarthritis of knee: Secondary | ICD-10-CM | POA: Diagnosis not present

## 2023-01-26 ENCOUNTER — Ambulatory Visit: Payer: BC Managed Care – PPO | Admitting: Physical Therapy

## 2023-01-26 DIAGNOSIS — M25662 Stiffness of left knee, not elsewhere classified: Secondary | ICD-10-CM

## 2023-01-26 DIAGNOSIS — M25661 Stiffness of right knee, not elsewhere classified: Secondary | ICD-10-CM

## 2023-01-26 DIAGNOSIS — R262 Difficulty in walking, not elsewhere classified: Secondary | ICD-10-CM

## 2023-01-26 DIAGNOSIS — M170 Bilateral primary osteoarthritis of knee: Secondary | ICD-10-CM | POA: Diagnosis not present

## 2023-01-26 DIAGNOSIS — M25562 Pain in left knee: Secondary | ICD-10-CM | POA: Diagnosis not present

## 2023-01-26 DIAGNOSIS — M6281 Muscle weakness (generalized): Secondary | ICD-10-CM

## 2023-01-26 DIAGNOSIS — M25561 Pain in right knee: Secondary | ICD-10-CM | POA: Diagnosis not present

## 2023-01-26 NOTE — Therapy (Signed)
OUTPATIENT PHYSICAL THERAPY LOWER EXTREMITY TREATMENT   Patient Name: Dana Hamilton MRN: [id number] DOB:04-29-1961, 62 y.o., female Today's Date: 01/23/2023  END OF SESSION:  PT End of Session - 01/23/23 1036     Visit Number 4    Date for PT Re-Evaluation 03/09/23    Authorization Type BCBS    PT Start Time 1032    PT Stop Time 1112    PT Time Calculation (min) 40 min    Activity Tolerance Patient tolerated treatment well    Behavior During Therapy WFL for tasks assessed/performed             Past Medical History:  Diagnosis Date   Depression with anxiety    Headache    Heart murmur    History of COVID-19    History of rheumatic fever    Hyperlipidemia    Hypertension    Prediabetes    Past Surgical History:  Procedure Laterality Date   ANKLE FRACTURE SURGERY Right    TONSILLECTOMY     Patient Active Problem List   Diagnosis Date Noted   Chest pain 08/03/2017   Leukocytosis 08/03/2017   Tachycardia 08/03/2017   Hyperglycemia 08/03/2017   Lateral rectus muscle paralysis 05/24/2017   Chronic migraine 05/23/2017    PCP: Rachell Cipro MD  REFERRING PROVIDER: Everette Rank FNP  REFERRING DIAG: OA bil knees   THERAPY DIAG:  Knee stiffness; weakness; difficulty in walking  Rationale for Evaluation and Treatment: Rehabilitation  ONSET DATE: December 2023  SUBJECTIVE:   SUBJECTIVE STATEMENT: Less tired after last aquatic PT.  Wearing knee support today which she finds helpful.  Pain varies.  Had 2nd injection Tuesday and sore at injection site.  I'm doing HEP religiously.  Clam ex's pull on my groin.  I went antiquing and it was awful.    FROM EVAL: Covid in mid December and now with long covid symptoms; inflammation has worsened OA of knees.  I'm having balance issues too.  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 Yes NPRS scale: 3/10 medial right knee  (6/10 this morning) Pain location: right knee worse Aggravating factors: walking, up and down stairs one at a time (nervous about stairs especially down) occassional buckling sensation; standing Relieving factors: diclofenac gel; heat pain   PRECAUTIONS: Fall  WEIGHT BEARING RESTRICTIONS: No  FALLS:  Has patient fallen in last 6 months? Yes. 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; OA of thumb  PATIENT SURVEYS:  LEFS 41/80  COGNITION: Overall cognitive status: Within functional limits for tasks assessed       MUSCLE LENGTH: Hamstrings: Right 70 deg;  Left 70 deg  POSTURE: right knee varus   LOWER EXTREMITY ROM: right 3-125 degrees; left 0-125 degrees; able to do 1/3 squat Unable to single leg stand at all on right LE;  left SLS < 3 sec  LOWER EXTREMITY MMT: Able to rise sit to stand without UE assist; able to do SLR with mild right quad lag  MMT Right eval Left eval  Hip flexion 5 5  Hip extension    Hip abduction 4+ 4+  Hip adduction    Hip internal rotation    Hip external rotation    Knee flexion 4 4  Knee extension 4- 4-  Ankle dorsiflexion    Ankle plantarflexion    Ankle  inversion    Ankle eversion     (Blank rows = not tested)   FUNCTIONAL TESTS:  5x sit to stand without UE assist: 14.03 sec TUG 7.26 sec  GAIT: Comments: 3 min walk test 518 feet with HR 60, O2 99% pain rating 3/10  severe varus right knee    TODAY'S TREATMENT:                                                                                                                              DATE: 3/28: Nu-step 5 min L1  sidelying clams green band 10x Seated green band clams 10x Seated knee extension with green band fig 8 around feet 10x right/left  Seated hip flexion with ankle DF fig 8 around feet 8x right/left Seated green band leg press out 10x right/left Pt given green band for home use Sit to stand 22 inch height wider stance 2 sets of 5 Dead lift 2 5# dumbbells wide stance 5x; 2nd and 3rd set in staggered stance 5x each Goblet Squat in front of 20 inch box 2x5 reps 5#  Rating of perceived exertion 3 out of 10 end of session        3/25 Pt seen for aquatic therapy today.  Treatment took place in water 3.5-4.75 ft in depth at the [facility] pool. Temp of water was 91.  Pt entered/exited the pool via stairs with step-to pattern with bilat rail.    Walking without support - forward/ backward 3 laps;  Side stepping 1 lap, with arm addct/abdct x1 lap; with added rainbow hand floats with addct x 2 lap With bilat rainbow hand floats at side, under water x 3 laps total walking backward/forward High knee marching forward/backward with rainbow hand floats at surface Holding yellow hand floats:  SLS each LE x 15 sec; SLS with 3way toe tap x 10 each; heel raises x 10; hip abdct/ addct x 10 each; hip opener/capt morgan x 10 Holding wall: LE circles CW/CCW x 10 each  STS at bench in water with feet on step with cues for forward weight shift x 10 Seated on bench:  cycling,  hip abdct/addct in long sitting, alternating LAQ with DF - 2 sets  Quad stretch  with foot on bench (opp  foot on blue step) x 20s x 2 RLE, x 1 LLE At stairs:  foot on 2nd step with forward lunge into hip flexor stretch x 2 reps of 20sec on RLE, 1 rep on LLE Return to walking forward/ backwards  Pt requires the buoyancy and hydrostatic pressure of water for support, and to offload joints by unweighting joint load by at least 50 % in navel deep water and by at least 75-80% in chest to neck deep water.  Viscosity of the water is needed for resistance of strengthening. Water current perturbations provides challenge to standing balance requiring increased core activation.      Previous land 3/19: Review of initial HEP: reports sidelying hip abduction painful so substituted sidelying clams instead LAQ isometrics (pushing against other leg) 5 sec hold 10x Supine HS sets 5 sec hold 10x Bridge 10x SAQ 10x Seated knee flexion slides with towel on floor 10x Seated pushing feet into the floor to activate quads 10x Nu-step 5 min L1  Rating of perceived exertion 3 out of 10 end of session    PATIENT EDUCATION:  Education details: aquatic therapy intro Person educated: Patient Education method: Explanation Education comprehension: verbalized understanding  HOME EXERCISE PROGRAM: Access Code: [email or web address] URL: [email or web address] Date: 01/26/2023 Prepared by: Ruben Im  Exercises - Seated Long Arc Quad  - 1 x daily - 7 x weekly - 1 sets - 10 reps - Sit to Stand  - 1 x daily - 7 x weekly - 1 sets - 10 reps - Clamshell  - 1 x daily - 7 x weekly - 1 sets - 10 reps - Supine Bilateral Hamstring Sets  - 1 x daily - 7 x weekly - 1 sets - 10 reps - 5 hold - Supine Short Arc Quad  - 1 x daily - 7 x weekly - 1 sets - 10 reps - 5 hold - Supine Bridge  - 1 x daily - 7 x weekly - 1 sets - 10 reps - Seated Active Assistive Knee Flexion and Extension  - 1 x daily - 7 x weekly - 1 sets - 10 reps - Seated Knee Flexion  - 1 x daily - 7 x weekly - 1 sets - 10 reps - Clamshell with Resistance  - 1  x daily - 7 x weekly - 1 sets - 10 reps - Seated Leg Press with Resistance  - 1 x daily - 7 x weekly - 1 sets - 10 reps - Deadlift With Dumbbells  - 1 x daily - 7 x weekly - 3 sets - 5 reps - Goblet Squat with Kettlebell  - 1 x daily - 7 x weekly - 2 sets - 5 reps Access Code: [email or web address] URL: [email or web address] Date: 01/17/2023 Prepared by: Ruben Im  Exercises - Seated Long Arc Quad  - 1 x daily - 7 x weekly - 1 sets - 10 reps - Sit to Stand  - 1 x daily - 7 x weekly - 1 sets - 10 reps - Clamshell  - 1 x daily - 7 x weekly - 1 sets - 10 reps - Supine Bilateral Hamstring Sets  - 1 x daily - 7 x weekly - 1 sets - 10 reps - 5 hold - Supine Short Arc Quad  - 1 x daily - 7 x weekly - 1 sets - 10 reps - 5 hold - Supine Bridge  - 1 x daily - 7 x weekly - 1 sets -  10 reps - Seated Active Assistive Knee Flexion and Extension  - 1 x daily - 7 x weekly - 1 sets - 10 reps - Seated Knee Flexion  - 1 x daily - 7 x weekly - 1 sets - 10 reps ASSESSMENT:  CLINICAL IMPRESSION: Much improved exercise tolerance since initial visits.  Able to progress intensity and load to ex's today with minimal modifications (wider stance with lifts and squats). Therapist monitoring knee pain as well as fatigue level throughout session.   Perceived exertion level is low at the end of session.     OBJECTIVE IMPAIRMENTS: decreased activity tolerance, difficulty walking, decreased ROM, decreased strength, impaired perceived functional ability, and pain.   ACTIVITY LIMITATIONS: standing, squatting, stairs, and locomotion level  PARTICIPATION LIMITATIONS: meal prep, cleaning, community activity, occupation, and yard work  PERSONAL FACTORS: 1 comorbidity: long covid  are also affecting patient's functional outcome.   REHAB POTENTIAL: Good  CLINICAL DECISION MAKING: Stable/uncomplicated  EVALUATION COMPLEXITY: Low   GOALS: Goals reviewed with patient? Yes  SHORT TERM GOALS: Target date:  02/09/2023   The patient will demonstrate knowledge of basic self care strategies and exercises to promote healing   Baseline: Goal status: INITIAL  2.  The patient will report a 30% improvement in pain levels with functional activities which are currently difficult including walking, standing, and stairs Baseline:  Goal status: INITIAL  3.  Bil knee flexion ROM improved to 130 degrees needed for greater ease with stairs to access office Baseline:  Goal status: INITIAL  4.  Improved quad strength to 4/5 needed for greater ease with stairs and decrease risk of falls Baseline:  Goal status: INITIAL    LONG TERM GOALS: Target date: 03/09/2023    The patient will be independent in a safe self progression of a home exercise program to promote further recovery of function  Baseline:  Goal status: INITIAL  2.  The patient will report a 75% improvement in pain levels with functional activities which are currently difficult including walking, standing and stairs Baseline:  Goal status: INITIAL  3.  5x sit to stand improved to 12.5 sec indicating improved quad strength to 4+/5 Baseline:  Goal status: INITIAL  4.  The patient will have improved gait stamina and speed needed to ambulate 800 feet in 6 minutes  Baseline:  Goal status: INITIAL  5.  LEFS outcome score improved to  51/80    indicating improved function with less pain Baseline:  Goal status: INITIAL    PLAN:  PT FREQUENCY: 2x/week  PT DURATION: 8 weeks  PLANNED INTERVENTIONS: Therapeutic exercises, Therapeutic activity, Neuromuscular re-education, Balance training, Gait training, Patient/Family education, Self Care, Joint mobilization, Aquatic Therapy, Dry Needling, Electrical stimulation, Cryotherapy, Moist heat, Taping, Vasopneumatic device, Ultrasound, Ionotophoresis 4mg /ml Dexamethasone, Manual therapy, and Re-evaluation  PLAN FOR NEXT SESSION: aquatic PT secondary to sensitivity and pain with weight bearing;   LE strengthening, knee ROM, proprioception, balance;  monitor for excessive fatigue secondary to long covid symptoms  Ruben Im, PT 01/26/23 5:30 PM Phone: [phone] Fax: [phone]

## 2023-02-01 ENCOUNTER — Ambulatory Visit: Payer: BC Managed Care – PPO | Attending: Family Medicine | Admitting: Physical Therapy

## 2023-02-01 DIAGNOSIS — M6281 Muscle weakness (generalized): Secondary | ICD-10-CM

## 2023-02-01 DIAGNOSIS — M25661 Stiffness of right knee, not elsewhere classified: Secondary | ICD-10-CM | POA: Diagnosis not present

## 2023-02-01 DIAGNOSIS — M25662 Stiffness of left knee, not elsewhere classified: Secondary | ICD-10-CM

## 2023-02-01 DIAGNOSIS — R262 Difficulty in walking, not elsewhere classified: Secondary | ICD-10-CM

## 2023-02-01 NOTE — Therapy (Signed)
OUTPATIENT PHYSICAL THERAPY LOWER EXTREMITY TREATMENT   Patient Name: Dana Hamilton MRN: [id number] DOB:11-09-1960, 62 y.o., female Today's Date: 02/01/2023  END OF SESSION:  PT End of Session - 02/01/23 1146     Visit Number 6    Date for PT Re-Evaluation 03/09/23    Authorization Type BCBS    PT Start Time 1145    PT Stop Time 1225    PT Time Calculation (min) 40 min    Activity Tolerance Patient tolerated treatment well             Past Medical History:  Diagnosis Date   Depression with anxiety    Headache    Heart murmur    History of COVID-19    History of rheumatic fever    Hyperlipidemia    Hypertension    Prediabetes    Past Surgical History:  Procedure Laterality Date   ANKLE FRACTURE SURGERY Right    TONSILLECTOMY     Patient Active Problem List   Diagnosis Date Noted   Chest pain 08/03/2017   Leukocytosis 08/03/2017   Tachycardia 08/03/2017   Hyperglycemia 08/03/2017   Lateral rectus muscle paralysis 05/24/2017   Chronic migraine 05/23/2017    PCP: Rachell Cipro MD  REFERRING PROVIDER: Everette Rank FNP  REFERRING DIAG: OA bil knees   THERAPY DIAG:  Knee stiffness; weakness; difficulty in walking  Rationale for Evaluation and Treatment: Rehabilitation  ONSET DATE: December 2023  SUBJECTIVE:   SUBJECTIVE STATEMENT: Doing OK.  I walked with my daughter yesterday for 12 minutes and it bothered me, mostly from the cardiovascular standpoint.  Was able to do stairs reciprocally this morning. No muscle soreness after last visit.  I wanted to talk about the dead lifts, staggered stance.   FROM EVAL: Covid in mid December and now with long covid symptoms; inflammation has worsened OA of knees.  I'm having balance issues too.  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 Yes NPRS scale: 3/10 medial right knee  (6/10 this morning) Pain location: right knee worse Aggravating factors: walking, up and down stairs one at a time (nervous about stairs especially down) occassional buckling sensation; standing Relieving factors: diclofenac gel; heat pain   PRECAUTIONS: Fall  WEIGHT BEARING RESTRICTIONS: No  FALLS:  Has patient fallen in last 6 months? Yes. 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; OA of thumb  PATIENT SURVEYS:  LEFS 41/80  COGNITION: Overall cognitive status: Within functional limits for tasks assessed       MUSCLE LENGTH: Hamstrings: Right 70 deg; Left 70 deg  POSTURE: right knee varus   LOWER EXTREMITY  ROM: right 3-125 degrees; left 0-125 degrees; able to do 1/3 squat Unable to single leg stand at all on right LE;  left SLS < 3 sec  LOWER EXTREMITY MMT: Able to rise sit to stand without UE assist; able to do SLR with mild right quad lag  MMT Right eval Left eval 4/3  Hip flexion 5 5 5   Hip extension     Hip abduction 4+ 4+ 4+  Hip adduction     Hip internal rotation     Hip external rotation     Knee flexion 4 4 4+  Knee extension 4- 4- 4-  Ankle dorsiflexion     Ankle plantarflexion     Ankle inversion     Ankle  eversion      (Blank rows = not tested)   FUNCTIONAL TESTS:  5x sit to stand without UE assist: 14.03 sec TUG 7.26 sec  GAIT: Comments: 3 min walk test 518 feet with HR 60, O2 99% pain rating 3/10  severe varus right knee    TODAY'S TREATMENT:                                                                                                                              DATE: 4/3: Nu-step 10 min L2 while discussing status Staggered stance with 2 7# dumbbells (not comfortable on one side)  Dead lift 10# kettlebell then dumbbell to tall cone between ankles wide stance 5x 3 Goblet Squat in front of 20 inch box 10# 3x5 6 inch step ups 2 hand support 5x right/left (discomfort so limited repetition number) Leg press seat 8 reclined 90# bil; 40# single leg 2x10 each Hip machine 40# hip abduction and extension 10x right/left Discussed gym community options, gave info on [facility] 9 week program Discussed gym machines that would appropriate: recumbent bike, Nu-Step, leg press, seated hip machine, HS curls, leg extension machine     3/28: Nu-step 5 min L1  sidelying clams green band 10x Seated green band clams 10x Seated knee extension with green band fig 8 around feet 10x right/left  Seated hip flexion with ankle DF fig 8 around feet 8x right/left Seated green band leg press out 10x right/left Pt given green band for home use Sit to stand 22 inch height wider stance 2 sets of 5 Dead lift 2 5# dumbbells wide stance 5x; 2nd and 3rd set in staggered stance 5x each Goblet Squat in front of 20 inch box 2x5 reps 5#  Rating of perceived exertion 3 out of 10 end of session        3/25 Pt seen for aquatic therapy today.  Treatment took place in water 3.5-4.75 ft in depth at the [facility] pool. Temp of water was 91.  Pt entered/exited the pool via stairs with step-to pattern with bilat rail.    Walking without support - forward/ backward 3 laps;  Side stepping 1 lap, with arm  addct/abdct x1 lap;  with added rainbow hand floats with addct x 2 lap With bilat rainbow hand floats at side, under water x 3 laps total walking backward/forward High knee marching forward/backward with rainbow hand floats at surface Holding yellow hand floats:  SLS each LE x 15 sec; SLS with 3way toe tap x 10 each; heel raises x 10; hip abdct/ addct x 10 each; hip opener/capt morgan x 10 Holding wall: LE circles CW/CCW x 10 each  STS at bench in water with feet on step with cues for forward weight shift x 10 Seated on bench:  cycling,  hip abdct/addct in long sitting, alternating LAQ with DF - 2 sets  Quad stretch with foot on bench (opp foot on blue step) x 20s x 2 RLE, x 1 LLE At stairs:  foot on 2nd step with forward lunge into hip flexor stretch x 2 reps of 20sec on RLE, 1 rep on LLE Return to walking forward/ backwards  Pt requires the buoyancy and hydrostatic pressure of water for support, and to offload joints by unweighting joint load by at least 50 % in navel deep water and by at least 75-80% in chest to neck deep water.  Viscosity of the water is needed for resistance of strengthening. Water current perturbations provides challenge to standing balance requiring increased core activation.      Previous land 3/19: Review of initial HEP: reports sidelying hip abduction painful so substituted sidelying clams instead LAQ isometrics (pushing against other leg) 5 sec hold 10x Supine HS sets 5 sec hold 10x Bridge 10x SAQ 10x Seated knee flexion slides with towel on floor 10x Seated pushing feet into the floor to activate quads 10x Nu-step 5 min L1  Rating of perceived exertion 3 out of 10 end of session    PATIENT EDUCATION:  Education details: aquatic therapy intro Person educated: Patient Education method: Explanation Education comprehension: verbalized understanding  HOME EXERCISE PROGRAM: Access Code: [email or web address] URL: [email or web address] Date:  01/26/2023 Prepared by: Ruben Im  Exercises - Seated Long Arc Quad  - 1 x daily - 7 x weekly - 1 sets - 10 reps - Sit to Stand  - 1 x daily - 7 x weekly - 1 sets - 10 reps - Clamshell  - 1 x daily - 7 x weekly - 1 sets - 10 reps - Supine Bilateral Hamstring Sets  - 1 x daily - 7 x weekly - 1 sets - 10 reps - 5 hold - Supine Short Arc Quad  - 1 x daily - 7 x weekly - 1 sets - 10 reps - 5 hold - Supine Bridge  - 1 x daily - 7 x weekly - 1 sets - 10 reps - Seated Active Assistive Knee Flexion and Extension  - 1 x daily - 7 x weekly - 1 sets - 10 reps - Seated Knee Flexion  - 1 x daily - 7 x weekly - 1 sets - 10 reps - Clamshell with Resistance  - 1 x daily - 7 x weekly - 1 sets - 10 reps - Seated Leg Press with Resistance  - 1 x daily - 7 x weekly - 1 sets - 10 reps - Deadlift With Dumbbells  - 1 x daily - 7 x weekly - 3 sets - 5 reps - Goblet Squat with Kettlebell  - 1 x daily - 7 x weekly - 2 sets - 5 reps Access Code: [email or web address] URL: [email or web address] Date: 01/17/2023 Prepared by: [place]  Arc Quad  - 1 x daily - 7 x weekly - 1 sets - 10 reps - Sit to Stand  - 1 x daily - 7 x weekly - 1 sets - 10 reps - Clamshell  - 1 x daily - 7 x weekly - 1 sets - 10 reps - Supine Bilateral Hamstring Sets  - 1 x daily - 7 x weekly - 1 sets - 10 reps - 5 hold - Supine Short Arc Quad  - 1 x daily - 7 x weekly - 1 sets - 10 reps - 5 hold - Supine Bridge  - 1 x daily - 7 x weekly - 1 sets - 10 reps - Seated Active Assistive Knee Flexion and Extension  - 1 x daily - 7 x weekly - 1 sets - 10 reps - Seated Knee Flexion  - 1 x daily - 7 x weekly - 1 sets - 10 reps ASSESSMENT:  CLINICAL IMPRESSION: Able to progress to 10# with dead lifts to shin level and with goblet squats.  Able to perform with minimal pain using a wider stance.  She is limited in walking distance/time secondary to fatigue and stamina from long covid more than knee pain.  Exposed to gym  equipment that would be appropriate to target quads, HS and glutes.   Will check progress toward goals next visit.  Anticipate good progress.    OBJECTIVE IMPAIRMENTS: decreased activity tolerance, difficulty walking, decreased ROM, decreased strength, impaired perceived functional ability, and pain.   ACTIVITY LIMITATIONS: standing, squatting, stairs, and locomotion level  PARTICIPATION LIMITATIONS: meal prep, cleaning, community activity, occupation, and yard work  PERSONAL FACTORS: 1 comorbidity: long covid  are also affecting patient's functional outcome.   REHAB POTENTIAL: Good  CLINICAL DECISION MAKING: Stable/uncomplicated  EVALUATION COMPLEXITY: Low   GOALS: Goals reviewed with patient? Yes  SHORT TERM GOALS: Target date: 02/09/2023   The patient will demonstrate knowledge of basic self care strategies and exercises to promote healing   Baseline: Goal status:met 4/3  2.  The patient will report a 30% improvement in pain levels with functional activities which are currently difficult including walking, standing, and stairs Baseline:  Goal status: INITIAL  3.  Bil knee flexion ROM improved to 130 degrees needed for greater ease with stairs to access office Baseline:  Goal status: INITIAL  4.  Improved quad strength to 4/5 needed for greater ease with stairs and decrease risk of falls Baseline:  Goal status: INITIAL    LONG TERM GOALS: Target date: 03/09/2023    The patient will be independent in a safe self progression of a home exercise program to promote further recovery of function  Baseline:  Goal status: INITIAL  2.  The patient will report a 75% improvement in pain levels with functional activities which are currently difficult including walking, standing and stairs Baseline:  Goal status: INITIAL  3.  5x sit to stand improved to 12.5 sec indicating improved quad strength to 4+/5 Baseline:  Goal status: INITIAL  4.  The patient will have improved  gait stamina and speed needed to ambulate 800 feet in 6 minutes  Baseline:  Goal status: INITIAL  5.  LEFS outcome score improved to  51/80    indicating improved function with less pain Baseline:  Goal status: INITIAL    PLAN:  PT FREQUENCY: 2x/week  PT DURATION: 8 weeks  PLANNED INTERVENTIONS: Therapeutic exercises, Therapeutic activity, Neuromuscular re-education, Balance training, Gait training, Patient/Family education, Self Care, Joint mobilization, Aquatic Therapy,  Dry Needling, Electrical stimulation, Cryotherapy, Moist heat, Taping, Vasopneumatic device, Ultrasound, Ionotophoresis 4mg /ml Dexamethasone, Manual therapy, and Re-evaluation  PLAN FOR NEXT SESSION: check STGs, knee ROM, 5x sit to stand; 3 min walk test;  LEFS; aquatic PT secondary to sensitivity and pain with weight bearing;  LE strengthening, knee ROM, proprioception, balance;  monitor for excessive fatigue secondary to long covid symptoms  Ruben Im, PT 02/01/23 12:57 PM Phone: [phone] Fax: [phone]

## 2023-02-02 ENCOUNTER — Ambulatory Visit (HOSPITAL_BASED_OUTPATIENT_CLINIC_OR_DEPARTMENT_OTHER): Payer: BC Managed Care – PPO | Attending: Family Medicine | Admitting: Physical Therapy

## 2023-02-02 ENCOUNTER — Encounter (HOSPITAL_BASED_OUTPATIENT_CLINIC_OR_DEPARTMENT_OTHER): Payer: Self-pay | Admitting: Physical Therapy

## 2023-02-02 DIAGNOSIS — R262 Difficulty in walking, not elsewhere classified: Secondary | ICD-10-CM | POA: Insufficient documentation

## 2023-02-02 DIAGNOSIS — M6281 Muscle weakness (generalized): Secondary | ICD-10-CM | POA: Diagnosis not present

## 2023-02-02 DIAGNOSIS — M25661 Stiffness of right knee, not elsewhere classified: Secondary | ICD-10-CM | POA: Diagnosis not present

## 2023-02-02 DIAGNOSIS — M25662 Stiffness of left knee, not elsewhere classified: Secondary | ICD-10-CM

## 2023-02-02 NOTE — Therapy (Signed)
OUTPATIENT PHYSICAL THERAPY LOWER EXTREMITY TREATMENT   Patient Name: Dana Hamilton MRN: [id number] DOB:10-Jul-1961, 62 y.o., female Today's Date: 02/02/2023  END OF SESSION:  PT End of Session - 02/02/23 1029     Visit Number 7    Date for PT Re-Evaluation 03/09/23    Authorization Type BCBS    PT Start Time 0945    PT Stop Time 1026    PT Time Calculation (min) 41 min    Activity Tolerance Patient tolerated treatment well    Behavior During Therapy WFL for tasks assessed/performed              Past Medical History:  Diagnosis Date   Depression with anxiety    Headache    Heart murmur    History of COVID-19    History of rheumatic fever    Hyperlipidemia    Hypertension    Prediabetes    Past Surgical History:  Procedure Laterality Date   ANKLE FRACTURE SURGERY Right    TONSILLECTOMY     Patient Active Problem List   Diagnosis Date Noted   Chest pain 08/03/2017   Leukocytosis 08/03/2017   Tachycardia 08/03/2017   Hyperglycemia 08/03/2017   Lateral rectus muscle paralysis 05/24/2017   Chronic migraine 05/23/2017    PCP: Rachell Cipro MD  REFERRING PROVIDER: Everette Rank FNP  REFERRING DIAG: OA bil knees   THERAPY DIAG:  Knee stiffness; weakness; difficulty in walking  Rationale for Evaluation and Treatment: Rehabilitation  ONSET DATE: December 2023  SUBJECTIVE:   SUBJECTIVE STATEMENT: Pt was sore after yesterday's visit.  She has questions about the 9 wk program.   FROM EVAL: Covid in mid December and now with long covid symptoms; inflammation has worsened OA of knees.  I'm having balance issues too.  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 Yes NPRS scale: 3/10   Pain location: right knee worse Aggravating factors: walking, up and down stairs one at a time (nervous about stairs especially down) occassional buckling sensation; standing Relieving factors: diclofenac gel; heat pain   PRECAUTIONS: Fall  WEIGHT BEARING RESTRICTIONS: No  FALLS:  Has patient fallen in last 6 months? Yes. 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; OA of thumb  PATIENT SURVEYS:  LEFS 41/80  COGNITION: Overall cognitive status: Within functional limits for tasks assessed       MUSCLE LENGTH: Hamstrings: Right 70 deg; Left 70 deg  POSTURE: right knee varus   LOWER EXTREMITY ROM: right 3-125 degrees; left 0-125 degrees; able to do 1/3 squat Unable to single leg stand at all on right LE;  left SLS <  3 sec  LOWER EXTREMITY MMT: Able to rise sit to stand without UE assist; able to do SLR with mild right quad lag  MMT Right eval Left eval 4/3  Hip flexion 5 5 5   Hip extension     Hip abduction 4+ 4+ 4+  Hip adduction     Hip internal rotation     Hip external rotation     Knee flexion 4 4 4+  Knee extension 4- 4- 4-  Ankle dorsiflexion     Ankle plantarflexion     Ankle inversion     Ankle eversion      (Blank rows = not tested)   FUNCTIONAL TESTS:  5x sit to stand without UE assist: 14.03 sec TUG 7.26 sec  GAIT: Comments: 3 min walk test 518  feet with HR 60, O2 99% pain rating 3/10  severe varus right knee    TODAY'S TREATMENT:                                                                                                                              DATE:  4/4 Pt seen for aquatic therapy today.  Treatment took place in water 3.5-4.75 ft in depth at the [facility] pool. Temp of water was 91.  Pt entered/exited the pool via stairs with step-to pattern with bilat rail.    Walking without support - forward/ backward 3 laps;  Side stepping with arm addct/abdct x2 laps; with added yellow hand floats with addct x 2 lap With bilat yellow hand floats at side, under water x 2 laps total walking backward/forward High knee marching forward/backward with yellow  hand floats at surface UE on yellow hand floats:  walking split squats forward/ backward Without support: side lunge and return to neutral -alternating directions x 10; 3 way LE kicks x 10 each LE UE on yellow noodle: single leg forward lean x 5 each LE Single leg solid noodle press (hip flex/knee flex, to Straight leg) x 10 slow, x 10 fast with light UE support on wall Quad stretch with foot on bench behind her x 60s each, x 20 sec each Hamstring stretch with foot on 3rd step x 20s each    4/3: Nu-step 10 min L2 while discussing status Staggered stance with 2 7# dumbbells (not comfortable on one side)  Dead lift 10# kettlebell then dumbbell to tall cone between ankles wide stance 5x 3 Goblet Squat in front of 20 inch box 10# 3x5 6 inch step ups 2 hand support 5x right/left (discomfort so limited repetition number) Leg press seat 8 reclined 90# bil; 40# single leg 2x10 each Hip machine 40# hip abduction and extension 10x right/left Discussed gym community options, gave info on [facility] 9 week program Discussed gym machines that would appropriate: recumbent bike, Nu-Step, leg press, seated hip machine, HS curls, leg extension machine  PATIENT EDUCATION:  Education  details: exercise progressions/ modifications  Person educated: Patient Education method: Explanation  Education comprehension: verbalized understanding  HOME EXERCISE PROGRAM: Access Code: [email or web address] URL: [email or web address] Date: 01/26/2023 Prepared by: Ruben Im  Exercises - Seated Long Arc Quad  - 1 x daily - 7 x weekly - 1 sets - 10 reps - Sit to Stand  - 1 x daily - 7 x weekly - 1 sets - 10 reps - Clamshell  - 1 x daily - 7 x weekly - 1 sets - 10 reps - Supine Bilateral Hamstring Sets  - 1 x daily - 7 x weekly - 1 sets - 10 reps - 5 hold - Supine Short Arc Quad  - 1 x daily - 7 x weekly - 1 sets - 10 reps - 5 hold - Supine Bridge  - 1 x daily - 7 x weekly - 1 sets - 10 reps - Seated Active Assistive Knee Flexion and Extension  - 1 x daily - 7 x weekly - 1 sets - 10 reps - Seated Knee Flexion  - 1 x daily - 7 x weekly - 1 sets - 10 reps - Clamshell with Resistance  - 1 x daily - 7 x weekly - 1 sets - 10 reps - Seated Leg Press with Resistance  - 1 x daily - 7 x weekly - 1 sets - 10 reps - Deadlift With Dumbbells  - 1 x daily - 7 x weekly - 3 sets - 5 reps - Goblet Squat with Kettlebell  - 1 x daily - 7 x weekly - 2 sets - 5 reps  ASSESSMENT:  CLINICAL IMPRESSION: Pt tolerated aquatic exercises well, without increase in pain. Able to complete exercises with increased speed and progressed to using yellow hand floats. Pt able to tolerate increase quad stretch, standing on ground instead of on step with addition of pelvic tilt.  Pt has met STG#2.     Will check progress toward goals next land visit (ROM for knees, MMT of LE).     OBJECTIVE IMPAIRMENTS: decreased activity tolerance, difficulty walking, decreased ROM, decreased strength, impaired perceived functional ability, and pain.   ACTIVITY LIMITATIONS: standing, squatting, stairs, and locomotion level  PARTICIPATION LIMITATIONS: meal prep, cleaning, community activity, occupation, and yard work  PERSONAL  FACTORS: 1 comorbidity: long covid  are also affecting patient's functional outcome.   REHAB POTENTIAL: Good  CLINICAL DECISION MAKING: Stable/uncomplicated  EVALUATION COMPLEXITY: Low   GOALS: Goals reviewed with patient? Yes  SHORT TERM GOALS: Target date: 02/09/2023   The patient will demonstrate knowledge of basic self care strategies and exercises to promote healing   Baseline: Goal status:met 4/3  2.  The patient will report a 30% improvement in pain levels with functional activities which are currently difficult including walking, standing, and stairs Baseline:  Goal status: MET - 02/02/23  3.  Bil knee flexion ROM improved to 130 degrees needed for greater ease with stairs to access office Baseline:  Goal status: INITIAL  4.  Improved quad strength to 4/5 needed for greater ease with stairs and decrease risk of falls Baseline:  Goal status: INITIAL    LONG TERM GOALS: Target date: 03/09/2023    The patient will be independent in a safe self progression of a home exercise program to promote further recovery of function  Baseline:  Goal status: INITIAL  2.  The patient will report a 75% improvement in pain levels with functional activities which are currently difficult including walking, standing and stairs Baseline:  Goal status: INITIAL  3.  5x sit to stand improved  to 12.5 sec indicating improved quad strength to 4+/5 Baseline:  Goal status: INITIAL  4.  The patient will have improved gait stamina and speed needed to ambulate 800 feet in 6 minutes  Baseline:  Goal status: INITIAL  5.  LEFS outcome score improved to  51/80    indicating improved function with less pain Baseline:  Goal status: INITIAL    PLAN:  PT FREQUENCY: 2x/week  PT DURATION: 8 weeks  PLANNED INTERVENTIONS: Therapeutic exercises, Therapeutic activity, Neuromuscular re-education, Balance training, Gait training, Patient/Family education, Self Care, Joint mobilization, Aquatic  Therapy, Dry Needling, Electrical stimulation, Cryotherapy, Moist heat, Taping, Vasopneumatic device, Ultrasound, Ionotophoresis 4mg /ml Dexamethasone, Manual therapy, and Re-evaluation  PLAN FOR NEXT SESSION: check STGs, knee ROM, 5x sit to stand; 3 min walk test;  LEFS; aquatic PT secondary to sensitivity and pain with weight bearing;  LE strengthening, knee ROM, proprioception, balance;  monitor for excessive fatigue secondary to long covid symptoms  Kerin Perna, PTA 02/02/23 10:30 AM [facility] [place], [place], [address] Phone: [phone]   Fax:  [phone]

## 2023-02-07 ENCOUNTER — Encounter (HOSPITAL_BASED_OUTPATIENT_CLINIC_OR_DEPARTMENT_OTHER): Payer: Self-pay | Admitting: Physical Therapy

## 2023-02-07 ENCOUNTER — Ambulatory Visit (HOSPITAL_BASED_OUTPATIENT_CLINIC_OR_DEPARTMENT_OTHER): Payer: BC Managed Care – PPO | Admitting: Physical Therapy

## 2023-02-07 DIAGNOSIS — M25662 Stiffness of left knee, not elsewhere classified: Secondary | ICD-10-CM

## 2023-02-07 DIAGNOSIS — M6281 Muscle weakness (generalized): Secondary | ICD-10-CM | POA: Diagnosis not present

## 2023-02-07 DIAGNOSIS — M25661 Stiffness of right knee, not elsewhere classified: Secondary | ICD-10-CM | POA: Diagnosis not present

## 2023-02-07 DIAGNOSIS — M170 Bilateral primary osteoarthritis of knee: Secondary | ICD-10-CM | POA: Diagnosis not present

## 2023-02-07 DIAGNOSIS — R262 Difficulty in walking, not elsewhere classified: Secondary | ICD-10-CM | POA: Diagnosis not present

## 2023-02-07 NOTE — Therapy (Signed)
OUTPATIENT PHYSICAL THERAPY LOWER EXTREMITY TREATMENT   Patient Name: Dana Hamilton MRN: [id number] DOB:[birth year], 62 y.o., female Today's Date: 02/07/2023  END OF SESSION:  PT End of Session - 02/07/23 1541     Visit Number 8    Date for PT Re-Evaluation 03/09/23    Authorization Type BCBS    PT Start Time 1532    PT Stop Time 1614    PT Time Calculation (min) 42 min    Activity Tolerance Patient tolerated treatment well    Behavior During Therapy WFL for tasks assessed/performed              Past Medical History:  Diagnosis Date   Depression with anxiety    Headache    Heart murmur    History of COVID-19    History of rheumatic fever    Hyperlipidemia    Hypertension    Prediabetes    Past Surgical History:  Procedure Laterality Date   ANKLE FRACTURE SURGERY Right    TONSILLECTOMY     Patient Active Problem List   Diagnosis Date Noted   Chest pain 08/03/2017   Leukocytosis 08/03/2017   Tachycardia 08/03/2017   Hyperglycemia 08/03/2017   Lateral rectus muscle paralysis 05/24/2017   Chronic migraine 05/23/2017    PCP: Maryelizabeth Rowan MD  REFERRING PROVIDER: Tillman Sers FNP  REFERRING DIAG: OA bil knees   THERAPY DIAG:  Knee stiffness; weakness; difficulty in walking  Rationale for Evaluation and Treatment: Rehabilitation  ONSET DATE: December 2023  SUBJECTIVE:   SUBJECTIVE STATEMENT: Pt had her 3rd injections in knees today.  She states she feels "off".  She reports she won't plan back to back PT appts in future.   FROM EVAL: Covid in mid December and now with long covid symptoms; inflammation has worsened OA of knees.  I'm having balance issues too.  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 Yes NPRS scale: 3/10   Pain location: Bilat knees; right knee worse Aggravating factors: walking, up and down stairs one at a time (nervous about stairs especially down) occassional buckling sensation; standing Relieving factors: diclofenac gel; heat pain   PRECAUTIONS: Fall  WEIGHT BEARING RESTRICTIONS: No  FALLS:  Has patient fallen in last 6 months? Yes. 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; OA of thumb  PATIENT SURVEYS:  LEFS 41/80  COGNITION: Overall cognitive status: Within functional limits for tasks assessed       MUSCLE LENGTH: Hamstrings: Right 70 deg; Left 70 deg  POSTURE: right knee varus   LOWER EXTREMITY ROM: right 3-125 degrees; left 0-125 degrees; able to do 1/3 squat  Unable to single leg stand at all on right LE;  left SLS < 3 sec  LOWER EXTREMITY MMT: Able to rise sit to stand without UE assist; able to do SLR with mild right quad lag  MMT Right eval Left eval 4/3  Hip flexion 5 5 5   Hip extension     Hip abduction 4+ 4+ 4+  Hip adduction     Hip internal rotation     Hip external rotation     Knee flexion 4 4 4+  Knee extension 4- 4- 4-  Ankle dorsiflexion     Ankle plantarflexion     Ankle inversion     Ankle eversion      (Blank rows = not tested)   FUNCTIONAL TESTS:  5x sit to stand without UE assist:  14.03 sec TUG 7.26 sec  GAIT: Comments: 3 min walk test 518 feet with HR 60, O2 99% pain rating 3/10  severe varus right knee    TODAY'S TREATMENT:                                                                                                                              DATE:  4/9 Pt seen for aquatic therapy today.  Treatment took place in water 3.5-4.75 ft in depth at the [facility] pool. Temp of water was 91.  Pt entered/exited the pool via stairs with step-to pattern with bilat rail.    Walking without support - forward/ backward 3 laps;  Side stepping with arm addct/abdct x2 laps; with added yellow hand floats with addct x 2 lap With bilat yellow hand floats at side, under water x 2 laps total walking backward/forward Straddling yellow noodle:  cycling with breast stroke arms; reverse jumping jacks; cc ski; return to cycling UE on yellow hand floats: forward lunge/ side lunge x 5 each side; single leg forward lean x 5 each LE Staggered stance with reciprocal arm swing with green (light) resistance bells; repeated with forward/ backward walking  Quad stretch with foot on bench behind her x 45 sec each Hamstring stretch with foot on 3rd step x 20s each; bilat calf stretch with heels off of step    4/4 Pt seen for aquatic therapy today.  Treatment took place in water 3.5-4.75 ft in depth at the [facility] pool. Temp of water was 91.  Pt entered/exited the pool via stairs with step-to pattern with bilat rail.    Walking without support - forward/ backward 3 laps;  Side stepping with arm addct/abdct x2 laps; with added yellow hand floats with addct x 2 lap With bilat yellow hand floats at side, under water x 2 laps total walking backward/forward High knee marching forward/backward with yellow  hand floats at surface UE on yellow hand floats:  walking split squats forward/ backward Without support: side lunge and return to neutral -alternating directions x 10; 3 way  LE kicks x 10 each LE UE on yellow  noodle: single leg forward lean x 5 each LE Single leg solid noodle press (hip flex/knee flex, to Straight leg) x 10 slow, x 10 fast with light UE support on wall Quad stretch with foot on bench behind her x 60s each, x 20 sec each Hamstring stretch with foot on 3rd step x 20s each    4/3: Nu-step 10 min L2 while discussing status Staggered stance with 2 7# dumbbells (not comfortable on one side)  Dead lift 10# kettlebell then dumbbell to tall cone between ankles wide stance 5x 3 Goblet Squat in front of 20 inch box 10# 3x5 6 inch step ups 2 hand support 5x right/left (discomfort so limited repetition number) Leg press seat 8 reclined 90# bil; 40# single leg 2x10 each Hip machine 40# hip abduction and extension 10x right/left Discussed gym community options, gave info on [facility] 9 week program Discussed gym machines that would appropriate: recumbent bike, Nu-Step, leg press, seated hip machine, HS curls, leg extension machine  PATIENT EDUCATION:  Education details: exercise progressions/ modifications  Person educated: Patient Education method: Explanation Education comprehension: verbalized understanding  HOME EXERCISE PROGRAM: Access Code: [email or web address] URL: [email or web address] Date: 01/26/2023 Prepared by: Lavinia SharpsStacy Simpson  Exercises - Seated Long Arc Quad  - 1 x daily - 7 x weekly - 1 sets - 10 reps - Sit to Stand  - 1 x daily - 7 x weekly - 1 sets - 10 reps - Clamshell  - 1 x daily - 7 x weekly - 1 sets - 10 reps - Supine Bilateral Hamstring Sets  - 1 x daily - 7 x weekly - 1 sets - 10 reps - 5 hold - Supine Short Arc Quad  - 1 x daily - 7 x weekly - 1 sets - 10 reps - 5 hold - Supine Bridge  - 1 x daily - 7 x weekly - 1 sets - 10 reps - Seated Active Assistive Knee Flexion and Extension  - 1 x daily - 7 x weekly - 1 sets - 10 reps - Seated Knee Flexion  - 1 x daily - 7 x weekly - 1 sets - 10 reps - Clamshell with Resistance  - 1  x daily - 7 x weekly - 1 sets - 10 reps - Seated Leg Press with Resistance  - 1 x daily - 7 x weekly - 1 sets - 10 reps - Deadlift With Dumbbells  - 1 x daily - 7 x weekly - 3 sets - 5 reps - Goblet Squat with Kettlebell  - 1 x daily - 7 x weekly - 2 sets - 5 reps  ASSESSMENT:  CLINICAL IMPRESSION: Pt tolerated aquatic exercises well, without increase in pain. Trialed resistance bells with UE while walking. Pt reported she felt better after session than when she arrived. Pt given more specific info (paper) on the 9 wk transition program.    Will check progress toward goals next land visit (ROM for knees, MMT of LE).     OBJECTIVE IMPAIRMENTS: decreased activity tolerance, difficulty walking, decreased ROM, decreased strength, impaired perceived functional ability, and pain.   ACTIVITY LIMITATIONS: standing, squatting, stairs, and locomotion level  PARTICIPATION LIMITATIONS: meal prep, cleaning, community activity, occupation, and yard work  PERSONAL FACTORS: 1 comorbidity: long covid  are also affecting patient's functional outcome.   REHAB POTENTIAL: Good  CLINICAL DECISION MAKING: Stable/uncomplicated  EVALUATION COMPLEXITY: Low   GOALS: Goals reviewed with patient? Yes  SHORT TERM GOALS: Target date: 02/09/2023   The patient  will demonstrate knowledge of basic self care strategies and exercises to promote healing   Baseline: Goal status:met 4/3  2.  The patient will report a 30% improvement in pain levels with functional activities which are currently difficult including walking, standing, and stairs Baseline:  Goal status: MET - 02/02/23  3.  Bil knee flexion ROM improved to 130 degrees needed for greater ease with stairs to access office Baseline:  Goal status: INITIAL  4.  Improved quad strength to 4/5 needed for greater ease with stairs and decrease risk of falls Baseline:  Goal status: INITIAL    LONG TERM GOALS: Target date: 03/09/2023    The patient will be  independent in a safe self progression of a home exercise program to promote further recovery of function  Baseline:  Goal status: INITIAL  2.  The patient will report a 75% improvement in pain levels with functional activities which are currently difficult including walking, standing and stairs Baseline:  Goal status: INITIAL  3.  5x sit to stand improved to 12.5 sec indicating improved quad strength to 4+/5 Baseline:  Goal status: INITIAL  4.  The patient will have improved gait stamina and speed needed to ambulate 800 feet in 6 minutes  Baseline:  Goal status: INITIAL  5.  LEFS outcome score improved to  51/80    indicating improved function with less pain Baseline:  Goal status: INITIAL    PLAN:  PT FREQUENCY: 2x/week  PT DURATION: 8 weeks  PLANNED INTERVENTIONS: Therapeutic exercises, Therapeutic activity, Neuromuscular re-education, Balance training, Gait training, Patient/Family education, Self Care, Joint mobilization, Aquatic Therapy, Dry Needling, Electrical stimulation, Cryotherapy, Moist heat, Taping, Vasopneumatic device, Ultrasound, Ionotophoresis 4mg /ml Dexamethasone, Manual therapy, and Re-evaluation  PLAN FOR NEXT SESSION: check STGs, knee ROM, 5x sit to stand; 3 min walk test;  LEFS; aquatic PT secondary to sensitivity and pain with weight bearing;  LE strengthening, knee ROM, proprioception, balance;  monitor for excessive fatigue secondary to long covid symptoms  Mayer Camel, PTA 02/07/23 4:27 PM [facility] [address] [place], [place], [address] Phone: [phone]   Fax:  [phone]

## 2023-02-09 ENCOUNTER — Ambulatory Visit: Payer: BC Managed Care – PPO | Admitting: Physical Therapy

## 2023-02-09 DIAGNOSIS — M25661 Stiffness of right knee, not elsewhere classified: Secondary | ICD-10-CM

## 2023-02-09 DIAGNOSIS — M25662 Stiffness of left knee, not elsewhere classified: Secondary | ICD-10-CM | POA: Diagnosis not present

## 2023-02-09 DIAGNOSIS — M6281 Muscle weakness (generalized): Secondary | ICD-10-CM

## 2023-02-09 DIAGNOSIS — R262 Difficulty in walking, not elsewhere classified: Secondary | ICD-10-CM | POA: Diagnosis not present

## 2023-02-09 NOTE — Therapy (Signed)
OUTPATIENT PHYSICAL THERAPY LOWER EXTREMITY TREATMENT   Patient Name: Dana RinkCaroline Ladley MRN: [id number] DOB:06/10/1961, 62 y.o., female Today's Date: 02/09/2023  END OF SESSION:  PT End of Session - 02/09/23 0843     Visit Number 9    Date for PT Re-Evaluation 03/09/23    Authorization Type BCBS    PT Start Time 0845    PT Stop Time 0926    PT Time Calculation (min) 41 min    Activity Tolerance Patient tolerated treatment well              Past Medical History:  Diagnosis Date   Depression with anxiety    Headache    Heart murmur    History of COVID-19    History of rheumatic fever    Hyperlipidemia    Hypertension    Prediabetes    Past Surgical History:  Procedure Laterality Date   ANKLE FRACTURE SURGERY Right    TONSILLECTOMY     Patient Active Problem List   Diagnosis Date Noted   Chest pain 08/03/2017   Leukocytosis 08/03/2017   Tachycardia 08/03/2017   Hyperglycemia 08/03/2017   Lateral rectus muscle paralysis 05/24/2017   Chronic migraine 05/23/2017    PCP: Maryelizabeth Rowanewey,Elizabeth MD  REFERRING PROVIDER: Tillman SersHarrington,Nikita FNP  REFERRING DIAG: OA bil knees   THERAPY DIAG:  Knee stiffness; weakness; difficulty in walking  Rationale for Evaluation and Treatment: Rehabilitation  ONSET DATE: December 2023  SUBJECTIVE:   SUBJECTIVE STATEMENT: Got more info on the [facility] program.  Got my 3rd shot in my right knee and I have more pain and swelling today.  My left knee doesn't hurt at all.     FROM EVAL: Covid in mid December and now with long covid symptoms; inflammation has worsened OA of knees.  I'm having balance issues too.  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 Yes NPRS scale: 5/10  right knee Pain location: Bilat knees; right knee worse Aggravating factors: walking, up and down stairs one at a time (nervous about stairs especially down) occassional buckling sensation; standing Relieving factors: diclofenac gel; heat pain   PRECAUTIONS: Fall  WEIGHT BEARING RESTRICTIONS: No  FALLS:  Has patient fallen in last 6 months? Yes. 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; OA of thumb  PATIENT SURVEYS:  LEFS 41/80  COGNITION: Overall cognitive status: Within functional limits for tasks assessed       MUSCLE LENGTH: Hamstrings: Right 70 deg; Left 70 deg  POSTURE: right knee varus   LOWER EXTREMITY ROM: right 3-125 degrees; left 0-125 degrees; able to do 1/3 squat Unable to  single leg stand at all on right LE;  left SLS < 3 sec  4/11:Right:  3-131 Left:  0-135 LOWER EXTREMITY MMT: Able to rise sit to stand without UE assist; able to do SLR with mild right quad lag  MMT Right eval Left eval 4/3 4/11   Hip flexion 5 5 5    Hip extension      Hip abduction 4+ 4+ 4+ 4+   Hip adduction      Hip internal rotation      Hip external rotation      Knee flexion 4 4 4+   Knee extension 4- 4- 4- 4  Ankle dorsiflexion      Ankle plantarflexion      Ankle inversion      Ankle eversion       (Blank rows = not tested)   FUNCTIONAL TESTS:  5x sit to stand  without UE assist: 14.03 sec TUG 7.26 sec 4/11:   8.70 GAIT: Comments: 3 min walk test 518 feet with HR 60, O2 99% pain rating 3/10  severe varus right knee    TODAY'S TREATMENT:                                                                                                                              DATE: 4/11: Nu-step 7 min L5 while discussing status 5x sit to stand test ROM knee Discussed seated with heel on coffee table for extension mobility 3 min every 2 hours Seated LAQ 5# 10x right Seated knee extension stretch every 2 hours 3 min hold  Staggered stance with 2 7# dumbbells (not comfortable on one side)  Dead lift 10# kettlebell then dumbbell to tall cone between ankles wide stance 5x 2 Goblet Squat in front of 20 inch box 10# 2x5 Attempted backward lunge but more painful than step ups so opted to continue quad strengthening with this ex  Leg press seat 8  90# bil; 35# single leg 2x10 each      4/9 Pt seen for aquatic therapy today.  Treatment took place in water 3.5-4.75 ft in depth at the [facility] pool. Temp of water was 91.  Pt entered/exited the pool via stairs with step-to pattern with bilat rail.    Walking without support - forward/ backward 3 laps;  Side stepping with arm addct/abdct x2 laps; with added yellow hand floats with addct x 2 lap With bilat yellow hand floats at side, under water x 2 laps total walking backward/forward Straddling yellow noodle:  cycling with breast stroke arms; reverse jumping jacks; cc ski; return to cycling UE on yellow hand floats: forward lunge/ side lunge x 5 each side; single leg forward lean x 5 each LE Staggered stance with reciprocal arm swing with green (light) resistance bells; repeated with forward/ backward walking  Quad stretch with foot on bench behind her x 45 sec each Hamstring stretch with foot on 3rd step x 20s each; bilat  calf stretch with heels off of step    4/4 Pt seen for aquatic therapy today.   Treatment took place in water 3.5-4.75 ft in depth at the [facility] pool. Temp of water was 91.  Pt entered/exited the pool via stairs with step-to pattern with bilat rail.    Walking without support - forward/ backward 3 laps;  Side stepping with arm addct/abdct x2 laps; with added yellow hand floats with addct x 2 lap With bilat yellow hand floats at side, under water x 2 laps total walking backward/forward High knee marching forward/backward with yellow  hand floats at surface UE on yellow hand floats:  walking split squats forward/ backward Without support: side lunge and return to neutral -alternating directions x 10; 3 way LE kicks x 10 each LE UE on yellow noodle: single leg forward lean x 5 each LE Single leg solid noodle press (hip flex/knee flex, to Straight leg) x 10 slow, x 10 fast with light UE support on wall Quad stretch with foot on bench behind her x 60s each, x 20 sec each Hamstring stretch with foot on 3rd step x 20s each    PATIENT EDUCATION:  Education details: exercise progressions/ modifications  Person educated: Patient Education method: Explanation Education comprehension: verbalized understanding  HOME EXERCISE PROGRAM: Access Code: [email or web address] URL: [email or web address] Date: 01/26/2023 Prepared by: Lavinia Sharps  Exercises - Seated Long Arc Quad  - 1 x daily - 7 x weekly - 1 sets - 10 reps - Sit to Stand  - 1 x daily - 7 x weekly - 1 sets - 10 reps - Clamshell  - 1 x daily - 7 x weekly - 1 sets - 10 reps - Supine Bilateral Hamstring Sets  - 1 x daily - 7 x weekly - 1 sets - 10 reps - 5 hold - Supine Short Arc Quad  - 1 x daily - 7 x weekly - 1 sets - 10 reps - 5 hold - Supine Bridge  - 1 x daily - 7 x weekly - 1 sets - 10 reps - Seated Active Assistive Knee Flexion and Extension  - 1 x daily - 7 x weekly - 1 sets - 10 reps - Seated Knee Flexion  - 1 x daily - 7 x weekly - 1 sets - 10 reps - Clamshell with Resistance  - 1 x daily -  7 x weekly - 1 sets - 10 reps - Seated Leg Press with Resistance  - 1 x daily - 7 x weekly - 1 sets - 10 reps - Deadlift With Dumbbells  - 1 x daily - 7 x weekly - 3 sets - 5 reps - Goblet Squat with Kettlebell  - 1 x daily - 7 x weekly - 2 sets - 5 reps  ASSESSMENT:  CLINICAL IMPRESSION: Good improvement in 5x sit to stand and knee ROM bilaterally.   Right knee pain requiring modifications to exercise: wider stance, lower volume of ex, lighter loading.  Improving stamina noted compared to start of care  with fewer rest breaks needed (long covid).  Progressing with rehab goals.     OBJECTIVE IMPAIRMENTS: decreased activity tolerance, difficulty walking, decreased ROM, decreased strength, impaired perceived functional ability, and pain.   ACTIVITY LIMITATIONS: standing, squatting, stairs, and locomotion level  PARTICIPATION LIMITATIONS: meal prep, cleaning, community activity, occupation, and yard work  PERSONAL FACTORS: 1 comorbidity: long covid  are also affecting patient's functional outcome.   REHAB POTENTIAL: Good  CLINICAL DECISION MAKING: Stable/uncomplicated  EVALUATION COMPLEXITY: Low   GOALS: Goals reviewed with patient? Yes  SHORT TERM GOALS: Target date: 02/09/2023   The patient will demonstrate knowledge of basic self care strategies and exercises to promote healing   Baseline: Goal status:met 4/3  2.  The patient will report a 30% improvement in pain levels with functional activities which are currently difficult including walking, standing, and stairs Baseline:  Goal status: MET - 02/02/23  3.  Bil knee flexion ROM improved to 130 degrees needed for greater ease with stairs to access office Baseline:  Goal status: met 4/11 4.  Improved quad strength to 4/5 needed for greater ease with stairs and decrease risk of falls Baseline:  Goal status: met 4/11    LONG TERM GOALS: Target date: 03/09/2023    The patient will be independent in a safe self progression  of a home exercise program to promote further recovery of function  Baseline:  Goal status: INITIAL  2.  The patient will report a 75% improvement in pain levels with functional activities which are currently difficult including walking, standing and stairs Baseline:  Goal status: INITIAL  3.  5x sit to stand improved to 12.5 sec indicating improved quad strength to 4+/5 Baseline:  Goal status: met  4.  The patient will have improved gait stamina and speed needed to ambulate 800 feet in 6 minutes  Baseline:  Goal status: INITIAL  5.  LEFS outcome score improved to  51/80    indicating improved function with less pain Baseline:  Goal status: INITIAL    PLAN:  PT FREQUENCY: 2x/week  PT DURATION: 8 weeks  PLANNED INTERVENTIONS: Therapeutic exercises, Therapeutic activity, Neuromuscular re-education, Balance training, Gait training, Patient/Family education, Self Care, Joint mobilization, Aquatic Therapy, Dry Needling, Electrical stimulation, Cryotherapy, Moist heat, Taping, Vasopneumatic device, Ultrasound, Ionotophoresis 4mg /ml Dexamethasone, Manual therapy, and Re-evaluation  PLAN FOR NEXT SESSION: check STGs, knee ROM, 5x sit to stand; 3 min walk test;  LEFS; aquatic PT secondary to sensitivity and pain with weight bearing;  LE strengthening, knee ROM, proprioception, balance;  monitor for excessive fatigue secondary to long covid symptoms  Lavinia Sharps, PT 02/09/23 2:28 PM Phone: [phone] Fax: [phone]

## 2023-02-14 ENCOUNTER — Encounter (HOSPITAL_BASED_OUTPATIENT_CLINIC_OR_DEPARTMENT_OTHER): Payer: Self-pay | Admitting: Physical Therapy

## 2023-02-14 ENCOUNTER — Ambulatory Visit (HOSPITAL_BASED_OUTPATIENT_CLINIC_OR_DEPARTMENT_OTHER): Payer: BC Managed Care – PPO | Admitting: Physical Therapy

## 2023-02-14 DIAGNOSIS — R262 Difficulty in walking, not elsewhere classified: Secondary | ICD-10-CM

## 2023-02-14 DIAGNOSIS — M6281 Muscle weakness (generalized): Secondary | ICD-10-CM | POA: Diagnosis not present

## 2023-02-14 DIAGNOSIS — M25662 Stiffness of left knee, not elsewhere classified: Secondary | ICD-10-CM | POA: Diagnosis not present

## 2023-02-14 DIAGNOSIS — M25661 Stiffness of right knee, not elsewhere classified: Secondary | ICD-10-CM

## 2023-02-14 NOTE — Therapy (Signed)
OUTPATIENT PHYSICAL THERAPY LOWER EXTREMITY TREATMENT   Patient Name: Dana Hamilton MRN: [id number] DOB:[birth year], 62 y.o., female Today's Date: 02/14/2023  END OF SESSION:  PT End of Session - 02/14/23 1447     Visit Number 10    Date for PT Re-Evaluation 03/09/23    Authorization Type BCBS    PT Start Time 1445    PT Stop Time 1525    PT Time Calculation (min) 40 min    Behavior During Therapy WFL for tasks assessed/performed              Past Medical History:  Diagnosis Date   Depression with anxiety    Headache    Heart murmur    History of COVID-19    History of rheumatic fever    Hyperlipidemia    Hypertension    Prediabetes    Past Surgical History:  Procedure Laterality Date   ANKLE FRACTURE SURGERY Right    TONSILLECTOMY     Patient Active Problem List   Diagnosis Date Noted   Chest pain 08/03/2017   Leukocytosis 08/03/2017   Tachycardia 08/03/2017   Hyperglycemia 08/03/2017   Lateral rectus muscle paralysis 05/24/2017   Chronic migraine 05/23/2017    PCP: Maryelizabeth Rowan MD  REFERRING PROVIDER: Tillman Sers FNP  REFERRING DIAG: OA bil knees   THERAPY DIAG:  Knee stiffness; weakness; difficulty in walking  Rationale for Evaluation and Treatment: Rehabilitation  ONSET DATE: December 2023  SUBJECTIVE:   SUBJECTIVE STATEMENT: "When I first get out of bed, I have to be a little careful (to avoid buckling)".     She started [employer] yesterday; will be M/W for [facility] program and Tue at aquatic therapy, Thurs at [facility].      FROM EVAL: Covid in mid December and now with long covid symptoms; inflammation has worsened OA of knees.  I'm having balance issues too.  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 Yes NPRS scale: 2/10  right knee Pain location: Rt knee Aggravating factors: walking, up and down stairs one at a time (nervous about stairs especially down) occassional buckling sensation; standing Relieving factors: diclofenac gel; heat pain   PRECAUTIONS: Fall  WEIGHT BEARING RESTRICTIONS: No  FALLS:  Has patient fallen in last 6 months? Yes. 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; OA of thumb  PATIENT SURVEYS:  LEFS 41/80  COGNITION: Overall cognitive status: Within functional limits for tasks assessed       MUSCLE LENGTH: Hamstrings: Right 70 deg; Left 70 deg  POSTURE: right knee varus   LOWER EXTREMITY ROM: right 3-125 degrees; left 0-125  degrees; able to do 1/3 squat Unable to single leg stand at all on right LE;  left SLS < 3 sec  4/11:Right:  3-131 Left:  0-135 LOWER EXTREMITY MMT: Able to rise sit to stand without UE assist; able to do SLR with mild right quad lag  MMT Right eval Left eval 4/3 4/11   Hip flexion 5 5 5    Hip extension      Hip abduction 4+ 4+ 4+ 4+   Hip adduction      Hip internal rotation      Hip external rotation      Knee flexion 4 4 4+   Knee extension 4- 4- 4- 4  Ankle dorsiflexion      Ankle plantarflexion      Ankle inversion      Ankle eversion       (Blank rows = not  tested)   FUNCTIONAL TESTS:  5x sit to stand without UE assist: 14.03 sec TUG 7.26 sec 4/11:   8.70 GAIT: Comments: 3 min walk test 518 feet with HR 60, O2 99% pain rating 3/10  severe varus right knee    TODAY'S TREATMENT:                                                                                                                              DATE:  4/16 Pt seen for aquatic therapy today.  Treatment took place in water 3.5-4.75 ft in depth at the [facility] pool. Temp of water was 91.  Pt entered/exited the pool via stairs with step-to pattern with bilat rail.    Walking without support - forward/ backward 3 laps;  Side stepping with added yellow hand floats with addct x 2 lap With bilat yellow hand floats at side, under water x 3 laps total marching backward/forward UE on yellow hand floats: forward lunge/ side lunge x 5 each side x 2 Single leg forward lean x 5 each LEx 2 Light jog forward/ backward (burning in R knee) Forward walking kick  3 way LE stretch with ankle supported by solid noodle Straddling yellow noodle:  cycling with breast stroke arms; reverse jumping jacks; cc ski Rt hamstring stretch at stairs x 15s Once dried off: applied reg Rock tape to Rt medial knee with 25% stretch to decompress tissue and increase proprioception. Pt instructed on safe tape removal.     4/11: Nu-step 7 min L5 while discussing status 5x sit to stand test ROM knee Discussed seated with heel on coffee table for extension mobility 3 min every 2 hours Seated LAQ 5# 10x right Seated knee extension stretch every 2 hours 3 min hold  Staggered stance with 2 7# dumbbells (not comfortable on one side)  Dead lift 10# kettlebell then dumbbell to tall cone between ankles wide stance 5x 2 Goblet Squat in front of 20 inch box 10# 2x5 Attempted backward lunge but more painful than step ups so opted to continue  quad strengthening with this ex  Leg press seat 8  90# bil; 35# single leg  2x10 each      4/9 Pt seen for aquatic therapy today.  Treatment took place in water 3.5-4.75 ft in depth at the [facility] pool. Temp of water was 91.  Pt entered/exited the pool via stairs with step-to pattern with bilat rail.    Walking without support - forward/ backward 3 laps;  Side stepping with arm addct/abdct x2 laps; with added yellow hand floats with addct x 2 lap With bilat yellow hand floats at side, under water x 2 laps total walking backward/forward Straddling yellow noodle:  cycling with breast stroke arms; reverse jumping jacks; cc ski; return to cycling UE on yellow hand floats: forward lunge/ side lunge x 5 each side; single leg forward lean x 5 each LE Staggered stance with reciprocal arm swing with green (light) resistance bells; repeated with forward/ backward walking  Quad stretch with foot on bench behind her x 45 sec each Hamstring stretch with foot on 3rd step x 20s each; bilat calf stretch with heels off of step      PATIENT EDUCATION:  Education details: exercise progressions/ modifications  Person educated: Patient Education method: Explanation Education comprehension: verbalized understanding  HOME EXERCISE PROGRAM: Access Code: [email or web address] URL: [email or web address] Date: 01/26/2023 Prepared by: Lavinia Sharps  Exercises - Seated Long Arc Quad  - 1 x daily - 7 x weekly - 1 sets - 10 reps - Sit to Stand  - 1 x daily - 7 x weekly - 1 sets - 10 reps - Clamshell  - 1 x daily - 7 x weekly - 1 sets - 10 reps - Supine Bilateral Hamstring Sets  - 1 x daily - 7 x weekly - 1 sets - 10 reps - 5 hold - Supine Short Arc Quad  - 1 x daily - 7 x weekly - 1 sets - 10 reps - 5 hold - Supine Bridge  - 1 x daily - 7 x weekly - 1 sets - 10 reps - Seated Active Assistive Knee Flexion and Extension  - 1 x daily - 7 x weekly - 1 sets - 10 reps - Seated Knee Flexion  - 1 x daily - 7 x weekly - 1 sets - 10 reps - Clamshell with Resistance  - 1 x  daily - 7 x weekly - 1 sets - 10 reps - Seated Leg Press with Resistance  - 1 x daily - 7 x weekly - 1 sets - 10 reps - Deadlift With Dumbbells  - 1 x daily - 7 x weekly - 3 sets - 5 reps - Goblet Squat with Kettlebell  - 1 x daily - 7 x weekly - 2 sets - 5 reps  ASSESSMENT:  CLINICAL IMPRESSION: Pt reported Rt knee "burning" at medial hamstring insertion with light jog and cycling; reduced with change in exercise and hamstring stretch.  Progressing difficulty of aquatic exercises gradually with good tolerance.  Progressing with rehab goals.     OBJECTIVE IMPAIRMENTS: decreased activity tolerance, difficulty walking, decreased ROM, decreased strength, impaired perceived functional ability, and pain.   ACTIVITY LIMITATIONS: standing, squatting, stairs, and locomotion level  PARTICIPATION LIMITATIONS: meal prep, cleaning, community activity, occupation, and yard work  PERSONAL FACTORS: 1 comorbidity: long covid  are also affecting patient's functional outcome.   REHAB POTENTIAL: Good  CLINICAL DECISION MAKING: Stable/uncomplicated  EVALUATION COMPLEXITY: Low   GOALS: Goals reviewed with patient? Yes  SHORT TERM  GOALS: Target date: 02/09/2023   The patient will demonstrate knowledge of basic self care strategies and exercises to promote healing   Baseline: Goal status:met 4/3  2.  The patient will report a 30% improvement in pain levels with functional activities which are currently difficult including walking, standing, and stairs Baseline:  Goal status: MET - 02/02/23  3.  Bil knee flexion ROM improved to 130 degrees needed for greater ease with stairs to access office Baseline:  Goal status: met 4/11 4.  Improved quad strength to 4/5 needed for greater ease with stairs and decrease risk of falls Baseline:  Goal status: met 4/11    LONG TERM GOALS: Target date: 03/09/2023    The patient will be independent in a safe self progression of a home exercise program to  promote further recovery of function  Baseline:  Goal status: INITIAL  2.  The patient will report a 75% improvement in pain levels with functional activities which are currently difficult including walking, standing and stairs Baseline:  Goal status: INITIAL  3.  5x sit to stand improved to 12.5 sec indicating improved quad strength to 4+/5 Baseline:  Goal status: met  4.  The patient will have improved gait stamina and speed needed to ambulate 800 feet in 6 minutes  Baseline:  Goal status: INITIAL  5.  LEFS outcome score improved to  51/80    indicating improved function with less pain Baseline:  Goal status: INITIAL    PLAN:  PT FREQUENCY: 2x/week  PT DURATION: 8 weeks  PLANNED INTERVENTIONS: Therapeutic exercises, Therapeutic activity, Neuromuscular re-education, Balance training, Gait training, Patient/Family education, Self Care, Joint mobilization, Aquatic Therapy, Dry Needling, Electrical stimulation, Cryotherapy, Moist heat, Taping, Vasopneumatic device, Ultrasound, Ionotophoresis /ml Dexamethasone, Manual therapy, and Re-evaluation  PLAN FOR NEXT SESSION:   LEFS; aquatic PT secondary to sensitivity and pain with weight bearing;  LE strengthening, knee ROM, proprioception, balance;  monitor for excessive fatigue secondary to long covid symptoms  Mayer Camel, PTA 02/14/23 3:26 PM [facility] [address] [place], [place], [address] Phone: [phone]   Fax:  [phone]

## 2023-02-16 ENCOUNTER — Ambulatory Visit: Payer: BC Managed Care – PPO | Admitting: Physical Therapy

## 2023-02-16 DIAGNOSIS — M25662 Stiffness of left knee, not elsewhere classified: Secondary | ICD-10-CM | POA: Diagnosis not present

## 2023-02-16 DIAGNOSIS — R262 Difficulty in walking, not elsewhere classified: Secondary | ICD-10-CM | POA: Diagnosis not present

## 2023-02-16 DIAGNOSIS — M6281 Muscle weakness (generalized): Secondary | ICD-10-CM

## 2023-02-16 DIAGNOSIS — M25661 Stiffness of right knee, not elsewhere classified: Secondary | ICD-10-CM

## 2023-02-16 NOTE — Therapy (Signed)
OUTPATIENT PHYSICAL THERAPY LOWER EXTREMITY TREATMENT   Patient Name: Dana Hamilton MRN: [id number] DOB:Jul 26, 1961, 62 y.o., female Today's Date: 02/16/2023  END OF SESSION:  PT End of Session - 02/16/23 0847     Visit Number 11    Date for PT Re-Evaluation 03/09/23    Authorization Type BCBS    PT Start Time 0845    PT Stop Time 0925    PT Time Calculation (min) 40 min    Activity Tolerance Patient tolerated treatment well              Past Medical History:  Diagnosis Date   Depression with anxiety    Headache    Heart murmur    History of COVID-19    History of rheumatic fever    Hyperlipidemia    Hypertension    Prediabetes    Past Surgical History:  Procedure Laterality Date   ANKLE FRACTURE SURGERY Right    TONSILLECTOMY     Patient Active Problem List   Diagnosis Date Noted   Chest pain 08/03/2017   Leukocytosis 08/03/2017   Tachycardia 08/03/2017   Hyperglycemia 08/03/2017   Lateral rectus muscle paralysis 05/24/2017   Chronic migraine 05/23/2017    PCP: Maryelizabeth Rowan MD  REFERRING PROVIDER: Tillman Sers FNP  REFERRING DIAG: OA bil knees   THERAPY DIAG:  Knee stiffness; weakness; difficulty in walking  Rationale for Evaluation and Treatment: Rehabilitation  ONSET DATE: December 2023  SUBJECTIVE:   SUBJECTIVE STATEMENT: Started [facility] program at [place], [place] and Wednesdays. The trainer took a lot of time with me to learn how to set the machines.  Did aquatic ex on Tuesday.  Minor muscle soreness today.  Wearing knee Rock tape which was helpful. Ordered KT tape.  I have left knee pain for the first time in a while.  Able to do reciprocal stairs sometimes.     FROM EVAL: Covid in mid December and now with long covid symptoms; inflammation has worsened OA of knees.  I'm having balance issues too.  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 Yes NPRS scale: 4/10  right knee; left knee 3/10 with walking Pain location: Rt knee Aggravating factors: walking, up and down stairs one at a time (nervous about stairs especially down) occassional buckling sensation; standing Relieving factors: diclofenac gel; heat pain   PRECAUTIONS: Fall  WEIGHT BEARING RESTRICTIONS: No  FALLS:  Has patient fallen in last 6 months? Yes. 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; OA of thumb  PATIENT SURVEYS:  LEFS 41/80  COGNITION: Overall cognitive status: Within functional limits for tasks assessed  MUSCLE LENGTH: Hamstrings: Right 70 deg; Left 70 deg  POSTURE: right knee varus   LOWER EXTREMITY ROM: right 3-125 degrees; left 0-125 degrees; able to do 1/3 squat Unable to single leg stand at all on right LE;  left SLS < 3 sec  4/11:Right:  3-131 Left:  0-135 LOWER EXTREMITY MMT: Able to rise sit to stand without UE assist; able to do SLR with mild right quad lag  MMT Right eval Left eval 4/3 4/11   Hip flexion 5 5 5    Hip extension      Hip abduction 4+ 4+ 4+ 4+   Hip adduction      Hip internal rotation      Hip external rotation      Knee  flexion 4 4 4+   Knee extension 4- 4- 4- 4  Ankle dorsiflexion      Ankle plantarflexion      Ankle inversion      Ankle eversion       (Blank rows = not tested)   FUNCTIONAL TESTS:  5x sit to stand without UE assist: 14.03 sec TUG 7.26 sec 4/11:   8.70 GAIT: Comments: 3 min walk test 518 feet with HR 60, O2 99% pain rating 3/10  severe varus right knee    TODAY'S TREATMENT:                                                                                                                              DATE: 4/18: Nu-step 8 min L3 while discussing status Squats goblet 15# kettlebell to 20 inch box 2 sets of 5, 3rd set 10x 15# kettlebell dead lifts to tall cone 2 sets of 5 30# barbell from 20 inch box height 3 sets of 5  Seated knee extension stretch with heel on 16 inch box 2 x 1 min Gait practice "up tall" to activate right glutes Hip machine right only 40# 10x each: extension, abduction, hip flexion; able to do left (WB on right) hip extension 5x, hip abduction 10x  Leg press seat 8  90# bil; 35# single leg 12x each right/left           4/16 Pt seen for aquatic therapy today.  Treatment took place in water 3.5-4.75 ft in depth at the [facility] pool. Temp of water was 91.  Pt entered/exited the pool via stairs with step-to pattern with bilat rail.    Walking without support - forward/ backward 3 laps;  Side stepping with added yellow hand floats with addct x 2 lap With bilat yellow hand floats at side, under water x 3 laps total marching backward/forward UE on yellow hand floats: forward lunge/ side lunge x 5 each side x 2 Single leg forward lean x 5 each LEx 2 Light jog forward/ backward (burning in R knee) Forward walking kick  3 way LE stretch with ankle supported by solid noodle Straddling yellow noodle:  cycling with breast stroke  arms; reverse jumping jacks; cc ski Rt hamstring stretch at stairs x 15s Once dried off: applied reg Rock tape to Rt medial  knee with 25% stretch to decompress tissue and increase proprioception. Pt instructed on safe tape removal.     4/11: Nu-step 7 min L5 while discussing status 5x sit to stand test ROM knee Discussed seated with heel on coffee table for extension mobility 3 min every 2 hours Seated LAQ 5# 10x right Seated knee extension stretch every 2 hours 3 min hold  Staggered stance with 2 7# dumbbells (not comfortable on one side)  Dead lift 10# kettlebell then dumbbell to tall cone between ankles wide stance 5x 2 Goblet Squat in front of 20 inch box 10# 2x5 Attempted backward lunge but more painful than step ups so opted to continue quad strengthening with this ex  Leg press seat 8  90# bil; 35# single leg 2x10 each      PATIENT EDUCATION:  Education details: exercise progressions/ modifications  Person educated: Patient Education method: Explanation Education comprehension: verbalized understanding  HOME EXERCISE PROGRAM: Access Code: [email or web address] URL: [email or web address] Date: 01/26/2023 Prepared by: Lavinia Sharps  Exercises - Seated Long Arc Quad  - 1 x daily - 7 x weekly - 1 sets - 10 reps - Sit to Stand  - 1 x daily - 7 x weekly - 1 sets - 10 reps - Clamshell  - 1 x daily - 7 x weekly - 1 sets - 10 reps - Supine Bilateral Hamstring Sets  - 1 x daily - 7 x weekly - 1 sets - 10 reps - 5 hold - Supine Short Arc Quad  - 1 x daily - 7 x weekly - 1 sets - 10 reps - 5 hold - Supine Bridge  - 1 x daily - 7 x weekly - 1 sets - 10 reps - Seated Active Assistive Knee Flexion and Extension  - 1 x daily - 7 x weekly - 1 sets - 10 reps - Seated Knee Flexion  - 1 x daily - 7 x weekly - 1 sets - 10 reps - Clamshell with Resistance  - 1 x daily - 7 x weekly - 1 sets - 10 reps - Seated Leg Press with Resistance  - 1 x daily - 7 x weekly - 1 sets - 10 reps - Deadlift With Dumbbells  - 1 x daily - 7 x weekly - 3 sets - 5 reps - Goblet Squat with Kettlebell  - 1 x daily - 7 x weekly - 2  sets - 5 reps  ASSESSMENT:  CLINICAL IMPRESSION: Able to increase load/intensity with dead lifts to knee level.  Verbal cues for head position.  Not painful with a wider than hip stance.  Genu varus on right and right glute weakness contributes to lateral teetering gait pattern.  She is highly compliant with her ex program which will positively affect long term prognosis.  Functional improvement reported in descending stairs reciprocally.     OBJECTIVE IMPAIRMENTS: decreased activity tolerance, difficulty walking, decreased ROM, decreased strength, impaired perceived functional ability, and pain.   ACTIVITY LIMITATIONS: standing, squatting, stairs, and locomotion level  PARTICIPATION LIMITATIONS: meal prep, cleaning, community activity, occupation, and yard work  PERSONAL FACTORS: 1 comorbidity: long covid  are also affecting patient's functional outcome.   REHAB POTENTIAL: Good  CLINICAL DECISION MAKING: Stable/uncomplicated  EVALUATION COMPLEXITY: Low   GOALS: Goals reviewed with patient? Yes  SHORT TERM GOALS: Target date: 02/09/2023   The patient  will demonstrate knowledge of basic self care strategies and exercises to promote healing   Baseline: Goal status:met 4/3  2.  The patient will report a 30% improvement in pain levels with functional activities which are currently difficult including walking, standing, and stairs Baseline:  Goal status: MET - 02/02/23  3.  Bil knee flexion ROM improved to 130 degrees needed for greater ease with stairs to access office Baseline:  Goal status: met 4/11 4.  Improved quad strength to 4/5 needed for greater ease with stairs and decrease risk of falls Baseline:  Goal status: met 4/11    LONG TERM GOALS: Target date: 03/09/2023    The patient will be independent in a safe self progression of a home exercise program to promote further recovery of function  Baseline:  Goal status: INITIAL  2.  The patient will report a 75%  improvement in pain levels with functional activities which are currently difficult including walking, standing and stairs Baseline:  Goal status: INITIAL  3.  5x sit to stand improved to 12.5 sec indicating improved quad strength to 4+/5 Baseline:  Goal status: met  4.  The patient will have improved gait stamina and speed needed to ambulate 800 feet in 6 minutes  Baseline:  Goal status: INITIAL  5.  LEFS outcome score improved to  51/80    indicating improved function with less pain Baseline:  Goal status: INITIAL    PLAN:  PT FREQUENCY: 2x/week  PT DURATION: 8 weeks  PLANNED INTERVENTIONS: Therapeutic exercises, Therapeutic activity, Neuromuscular re-education, Balance training, Gait training, Patient/Family education, Self Care, Joint mobilization, Aquatic Therapy, Dry Needling, Electrical stimulation, Cryotherapy, Moist heat, Taping, Vasopneumatic device, Ultrasound, Ionotophoresis /ml Dexamethasone, Manual therapy, and Re-evaluation  PLAN FOR NEXT SESSION:  increase weight on leg press double 100# and single leg 40#;  LEFS; aquatic PT secondary to sensitivity and pain with weight bearing;  LE strengthening, knee ROM, proprioception, balance;  monitor for excessive fatigue secondary to long covid symptoms  Lavinia Sharps, PT 02/16/23 6:46 PM Phone: [phone] Fax: [phone]

## 2023-02-21 ENCOUNTER — Encounter (HOSPITAL_BASED_OUTPATIENT_CLINIC_OR_DEPARTMENT_OTHER): Payer: Self-pay | Admitting: Physical Therapy

## 2023-02-21 ENCOUNTER — Ambulatory Visit (HOSPITAL_BASED_OUTPATIENT_CLINIC_OR_DEPARTMENT_OTHER): Payer: BC Managed Care – PPO | Admitting: Physical Therapy

## 2023-02-21 DIAGNOSIS — M25661 Stiffness of right knee, not elsewhere classified: Secondary | ICD-10-CM

## 2023-02-21 DIAGNOSIS — M6281 Muscle weakness (generalized): Secondary | ICD-10-CM | POA: Diagnosis not present

## 2023-02-21 DIAGNOSIS — R262 Difficulty in walking, not elsewhere classified: Secondary | ICD-10-CM

## 2023-02-21 DIAGNOSIS — M25662 Stiffness of left knee, not elsewhere classified: Secondary | ICD-10-CM | POA: Diagnosis not present

## 2023-02-21 DIAGNOSIS — E559 Vitamin D deficiency, unspecified: Secondary | ICD-10-CM | POA: Diagnosis not present

## 2023-02-21 DIAGNOSIS — E1165 Type 2 diabetes mellitus with hyperglycemia: Secondary | ICD-10-CM | POA: Diagnosis not present

## 2023-02-21 NOTE — Therapy (Signed)
OUTPATIENT PHYSICAL THERAPY LOWER EXTREMITY TREATMENT   Patient Name: Dana Hamilton MRN: [id number] DOB:27-Sep-1961, 62 y.o., female Today's Date: 02/21/2023  END OF SESSION:  PT End of Session - 02/21/23 1616     Visit Number 12    Date for PT Re-Evaluation 03/09/23    Authorization Type BCBS    PT Start Time 1450    PT Stop Time 1528    PT Time Calculation (min) 38 min    Activity Tolerance Patient tolerated treatment well    Behavior During Therapy WFL for tasks assessed/performed               Past Medical History:  Diagnosis Date   Depression with anxiety    Headache    Heart murmur    History of COVID-19    History of rheumatic fever    Hyperlipidemia    Hypertension    Prediabetes    Past Surgical History:  Procedure Laterality Date   ANKLE FRACTURE SURGERY Right    TONSILLECTOMY     Patient Active Problem List   Diagnosis Date Noted   Chest pain 08/03/2017   Leukocytosis 08/03/2017   Tachycardia 08/03/2017   Hyperglycemia 08/03/2017   Lateral rectus muscle paralysis 05/24/2017   Chronic migraine 05/23/2017    PCP: Maryelizabeth Rowan MD  REFERRING PROVIDER: Tillman Sers FNP  REFERRING DIAG: OA bil knees   THERAPY DIAG:  Knee stiffness; weakness; difficulty in walking  Rationale for Evaluation and Treatment: Rehabilitation  ONSET DATE: December 2023  SUBJECTIVE:   SUBJECTIVE STATEMENT: "It's like a switch was flipped. Last week I just started feeling more like myself."  "This is the best I have felt in a long time".     FROM EVAL: Covid in mid December and now with long covid symptoms; inflammation has worsened OA of knees.  I'm having balance issues too.  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 Yes  NPRS scale: 1/10 Pain location: Rt knee Aggravating factors: walking, up and down stairs one at a time Relieving factors: diclofenac gel; heat; tape   PRECAUTIONS: Fall  WEIGHT BEARING RESTRICTIONS: No  FALLS:  Has patient fallen in last 6 months? Yes. 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; OA of thumb  PATIENT SURVEYS:  LEFS 41/80  COGNITION: Overall cognitive status: Within functional limits for tasks assessed       MUSCLE LENGTH: Hamstrings: Right 70 deg; Left 70 deg  POSTURE: right knee varus   LOWER EXTREMITY ROM: right 3-125 degrees; left 0-125 degrees; able to do 1/3 squat Unable to single leg stand at all on right LE;  left SLS < 3 sec  4/11:Right:  3-131 Left:  0-135 LOWER EXTREMITY MMT: Able to rise sit to stand without UE assist; able to do SLR with mild right quad lag  MMT Right eval Left eval 4/3 4/11   Hip flexion 5 5 5    Hip extension      Hip abduction 4+ 4+ 4+ 4+   Hip adduction      Hip internal rotation      Hip external rotation      Knee flexion 4 4 4+   Knee extension 4- 4- 4- 4  Ankle dorsiflexion      Ankle plantarflexion      Ankle inversion      Ankle eversion       (Blank rows = not tested)   FUNCTIONAL TESTS:  5x sit to stand without UE assist: 14.03 sec TUG 7.26 sec 4/11:    8.70 GAIT: Comments: 3 min walk test 518 feet with HR 60, O2 99% pain rating 3/10  severe varus right knee    TODAY'S TREATMENT:                                                                                                                              DATE:  4/23 Pt seen for aquatic therapy today.  Treatment took place in water 3.5-4.75 ft in depth at the [facility] pool. Temp of water was 91.  Pt entered/exited the pool via stairs with step-to pattern with bilat rail.   Walking without support - forward/ backward 3 laps;  Side stepping with added rainbow hand floats with addct x 2 lap With bilat yellow hand floats at side, under water x 1.5 laps total marching backward/forward Forward walking kicks with yellow floats on surface 3 way RLE stretch with ankle supported by solid noodle Forward/backward jog (no burning in knee) Walking split squats (Rt knee burning)  Monster walk with cues for neutral Rt foot (vs inverted) Single leg solid noodle press (hip flex/knee flex, to Straight leg) x 10 slow, x 10 fast with light UE support on wall  Runners step up at stairs, eccentric slow lowering Gastroc stretch with heels off of step Once dried off: applied reg Rock tape to Rt medial knee with 25% stretch to decompress tissue and increase proprioception.   4/18: Nu-step 8 min L3 while discussing status Squats goblet 15# kettlebell to 20 inch box 2 sets of 5, 3rd set 10x 15# kettlebell dead lifts to tall cone 2 sets of 5 30# barbell from 20 inch box height 3 sets of 5  Seated knee extension stretch with heel on 16 inch box 2 x 1 min Gait practice "up tall" to activate right glutes Hip machine right only 40# 10x each: extension, abduction, hip flexion; able to do left (WB on right) hip extension 5x, hip abduction 10x  Leg press seat 8  90# bil; 35# single leg 12x each right/left   4/11:  Nu-step 7 min L5 while discussing status 5x sit to stand test ROM knee Discussed seated with  heel on coffee table for extension mobility 3 min every 2 hours Seated LAQ 5# 10x right Seated knee extension stretch every 2 hours 3 min hold  Staggered stance with 2 7# dumbbells (not comfortable on one side)  Dead lift 10# kettlebell then dumbbell to tall cone between ankles wide stance 5x 2 Goblet Squat in front of 20 inch box 10# 2x5 Attempted backward lunge but more painful than step ups so opted to continue quad strengthening with this ex  Leg press seat 8  90# bil; 35# single leg 2x10 each      PATIENT EDUCATION:  Education details: exercise progressions/ modifications  Person educated: Patient Education method: Explanation Education comprehension: verbalized understanding  HOME EXERCISE PROGRAM: Access Code: [email or web address] URL: [email or web address] Date: 01/26/2023 Prepared by: Lavinia Sharps  AQUATIC Access Code: [email or web address]  (not issued yet) URL: [email or web address] Date: 02/21/2023 Prepared by: [facility] [address] This aquatic home exercise program from MedBridge utilizes pictures from land based exercises, but has been adapted prior to lamination and issuance.    ASSESSMENT:  CLINICAL IMPRESSION: Pt had only intermittent episodes of burning in Rt medial knee.  Overall pain level in Rt knee remained low throughout session.  She continues with difficulty with forward step ups with RLE, retro step downs with LLE with control and she is not able to complete stairs without UE on rails.  Began assembling aquatic HEP - will finalize and issue next session at pool.  Pt making great progress towards remaining goals.  PT to assess LTG at upcoming land visit.     OBJECTIVE IMPAIRMENTS: decreased activity tolerance, difficulty walking, decreased ROM, decreased strength, impaired perceived functional ability, and pain.   ACTIVITY LIMITATIONS: standing, squatting, stairs, and locomotion level  PARTICIPATION LIMITATIONS: meal prep,  cleaning, community activity, occupation, and yard work  PERSONAL FACTORS: 1 comorbidity: long covid  are also affecting patient's functional outcome.   REHAB POTENTIAL: Good  CLINICAL DECISION MAKING: Stable/uncomplicated  EVALUATION COMPLEXITY: Low   GOALS: Goals reviewed with patient? Yes  SHORT TERM GOALS: Target date: 02/09/2023   The patient will demonstrate knowledge of basic self care strategies and exercises to promote healing   Baseline: Goal status:met 4/3  2.  The patient will report a 30% improvement in pain levels with functional activities which are currently difficult including walking, standing, and stairs Baseline:  Goal status: MET - 02/02/23  3.  Bil knee flexion ROM improved to 130 degrees needed for greater ease with stairs to access office Baseline:  Goal status: met 4/11 4.  Improved quad strength to 4/5 needed for greater ease with stairs and decrease risk of falls Baseline:  Goal status: met 4/11    LONG TERM GOALS: Target date: 03/09/2023    The patient will be independent in a safe self progression of a home exercise program to promote further recovery of function  Baseline:  Goal status: INITIAL  2.  The patient will report a 75% improvement in pain levels with functional activities which are currently difficult including walking, standing and stairs Baseline:  Goal status: INITIAL  3.  5x sit to stand improved to 12.5 sec indicating improved quad strength to 4+/5 Baseline:  Goal status: met  4.  The patient will have improved gait stamina and speed needed to ambulate 800 feet in 6 minutes  Baseline:  Goal status: INITIAL  5.  LEFS  outcome score improved to  51/80    indicating improved function with less pain Baseline:  Goal status: INITIAL    PLAN:  PT FREQUENCY: 2x/week  PT DURATION: 8 weeks  PLANNED INTERVENTIONS: Therapeutic exercises, Therapeutic activity, Neuromuscular re-education, Balance training, Gait training,  Patient/Family education, Self Care, Joint mobilization, Aquatic Therapy, Dry Needling, Electrical stimulation, Cryotherapy, Moist heat, Taping, Vasopneumatic device, Ultrasound, Ionotophoresis /ml Dexamethasone, Manual therapy, and Re-evaluation  PLAN FOR NEXT SESSION:  increase weight on leg press double 100# and single leg 40#;  LEFS; aquatic PT secondary to sensitivity and pain with weight bearing;  LE strengthening, knee ROM, proprioception, balance;  monitor for excessive fatigue secondary to long covid symptoms \

## 2023-02-23 ENCOUNTER — Ambulatory Visit: Payer: BC Managed Care – PPO | Admitting: Physical Therapy

## 2023-02-23 DIAGNOSIS — R262 Difficulty in walking, not elsewhere classified: Secondary | ICD-10-CM

## 2023-02-23 DIAGNOSIS — M25662 Stiffness of left knee, not elsewhere classified: Secondary | ICD-10-CM | POA: Diagnosis not present

## 2023-02-23 DIAGNOSIS — M25661 Stiffness of right knee, not elsewhere classified: Secondary | ICD-10-CM | POA: Diagnosis not present

## 2023-02-23 DIAGNOSIS — M6281 Muscle weakness (generalized): Secondary | ICD-10-CM

## 2023-02-23 NOTE — Therapy (Signed)
OUTPATIENT PHYSICAL THERAPY LOWER EXTREMITY TREATMENT   Patient Name: Dana Hamilton MRN: [id number] DOB:May 25, 1961, 62 y.o., female Today's Date: 02/23/2023  END OF SESSION:  PT End of Session - 02/23/23 1537     Visit Number 13    Date for PT Re-Evaluation 03/09/23    Authorization Type BCBS    PT Start Time 1530    PT Stop Time 1615    PT Time Calculation (min) 45 min    Activity Tolerance Patient tolerated treatment well               Past Medical History:  Diagnosis Date   Depression with anxiety    Headache    Heart murmur    History of COVID-19    History of rheumatic fever    Hyperlipidemia    Hypertension    Prediabetes    Past Surgical History:  Procedure Laterality Date   ANKLE FRACTURE SURGERY Right    TONSILLECTOMY     Patient Active Problem List   Diagnosis Date Noted   Chest pain 08/03/2017   Leukocytosis 08/03/2017   Tachycardia 08/03/2017   Hyperglycemia 08/03/2017   Lateral rectus muscle paralysis 05/24/2017   Chronic migraine 05/23/2017    PCP: Maryelizabeth Rowan MD  REFERRING PROVIDER: Tillman Sers FNP  REFERRING DIAG: OA bil knees   THERAPY DIAG:  Knee stiffness; weakness; difficulty in walking  Rationale for Evaluation and Treatment: Rehabilitation  ONSET DATE: December 2023  SUBJECTIVE:   SUBJECTIVE STATEMENT: No pain in left knee but still have pain in right knee.  I think I'm going to end up joining [employer].    Walked about 3 blocks last night, went to the [place] show so more sore today.    FROM EVAL: Covid in mid December and now with long covid symptoms; inflammation has worsened OA of knees.  I'm having balance issues too.  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 Yes  NPRS scale: 2/10 Pain location: Rt knee Aggravating factors: walking, up and down stairs one at a time Relieving factors: diclofenac gel; heat; tape   PRECAUTIONS: Fall  WEIGHT BEARING RESTRICTIONS: No  FALLS:  Has patient fallen in last 6 months? Yes. 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; OA of thumb  PATIENT SURVEYS:  LEFS 41/80  COGNITION: Overall cognitive status: Within functional limits for tasks assessed       MUSCLE LENGTH: Hamstrings: Right 70 deg; Left 70 deg  POSTURE: right knee varus   LOWER EXTREMITY ROM: right 3-125 degrees; left 0-125 degrees; able to do 1/3 squat Unable to single leg stand at all on  right LE;  left SLS < 3 sec  4/11:Right:  3-131 Left:  0-135 LOWER EXTREMITY MMT: Able to rise sit to stand without UE assist; able to do SLR with mild right quad lag  MMT Right eval Left eval 4/3 4/11   Hip flexion Hip extension      Hip abduction 4+ 4+ 4+ 4+   Hip adduction      Hip internal rotation      Hip external rotation      Knee flexion 4 4 4+   Knee extension 4- 4- 4- 4  Ankle dorsiflexion      Ankle plantarflexion      Ankle inversion      Ankle eversion       (Blank rows = not tested)   FUNCTIONAL TESTS:  5x sit to stand without UE assist: 14.03 sec TUG 7.26 sec 4/11:    8.70 GAIT: Comments: 3 min walk test 518 feet with HR 60, O2 99% pain rating 3/10  severe varus right knee    TODAY'S TREATMENT:      DATE:  4/25 Nu-step 8 min L3 while discussing status Squats goblet 15# kettlebell to 20 inch box 2 sets of 5, tried set with heels elevated but made no difference in squat depth 30# barbell from 20 inch box height 2 sets of 5  30# barbell dead lifts to mid lower leg 8x Gait practice "up tall" to activate right glutes Hip machine right only 40# 10x each: extension, abduction, hip flexion; too painful to WB on right for left movements Leg press seat 8  100# 12x bil; 40# single leg 12x each right/left (pain on left 3-4/10)                                                                                                                            DATE:  4/23 Pt seen for aquatic therapy today.  Treatment took place in water 3.5-4.75 ft in depth at the [facility] pool. Temp of water was 91.  Pt entered/exited the pool via stairs with step-to pattern with bilat rail.   Walking without support - forward/ backward 3 laps;  Side stepping with added rainbow hand floats with addct x 2 lap With bilat yellow hand floats at side, under water x 1.5 laps total marching backward/forward Forward walking kicks with yellow floats on surface 3 way RLE stretch with ankle supported by solid noodle Forward/backward jog (no burning in knee) Walking split squats (Rt knee burning)  Monster walk with cues for neutral Rt foot (vs inverted) Single leg solid noodle press (hip flex/knee flex, to Straight leg) x 10 slow, x 10 fast with light UE support on wall  Runners step up at stairs, eccentric slow lowering Gastroc stretch with heels off of step Once dried off: applied reg Rock tape to Rt medial knee with 25% stretch to decompress tissue and increase proprioception.    PATIENT  EDUCATION:  Education details: exercise progressions/ modifications  Person educated:  Patient Education method: Explanation Education comprehension: verbalized understanding  HOME EXERCISE PROGRAM: Access Code: [email or web address] URL: [email or web address] Date: 01/26/2023 Prepared by: Lavinia Sharps  AQUATIC Access Code: [email or web address]  (not issued yet) URL: [email or web address] Date: 02/21/2023 Prepared by: [facility] This aquatic home exercise program from [employer] utilizes pictures from land based exercises, but has been adapted prior to lamination and issuance.    ASSESSMENT:  CLINICAL IMPRESSION: Treatment session focus on finalizing land based ex HEP.  Min verbal cues for head position with dead lifts but otherwise good technique with partial squats to 20 inch height box with load and dead lifts to mid shin level.  She uses a wider stance to accommodate for left knee pain.  Anticipate good progress with objective rechecks and with finalization of aquatic ex program she may be ready for discharge from PT in the next 2-3 visits.    OBJECTIVE IMPAIRMENTS: decreased activity tolerance, difficulty walking, decreased ROM, decreased strength, impaired perceived functional ability, and pain.   ACTIVITY LIMITATIONS: standing, squatting, stairs, and locomotion level  PARTICIPATION LIMITATIONS: meal prep, cleaning, community activity, occupation, and yard work  PERSONAL FACTORS: 1 comorbidity: long covid  are also affecting patient's functional outcome.   REHAB POTENTIAL: Good  CLINICAL DECISION MAKING: Stable/uncomplicated  EVALUATION COMPLEXITY: Low   GOALS: Goals reviewed with patient? Yes  SHORT TERM GOALS: Target date: 02/09/2023   The patient will demonstrate knowledge of basic self care strategies and exercises to promote healing   Baseline: Goal status:met 4/3  2.  The patient will report a 30% improvement in pain levels with functional activities which are currently difficult including walking, standing,  and stairs Baseline:  Goal status: MET - 02/02/23  3.  Bil knee flexion ROM improved to 130 degrees needed for greater ease with stairs to access office Baseline:  Goal status: met 4/11 4.  Improved quad strength to 4/5 needed for greater ease with stairs and decrease risk of falls Baseline:  Goal status: met 4/11    LONG TERM GOALS: Target date: 03/09/2023    The patient will be independent in a safe self progression of a home exercise program to promote further recovery of function  Baseline:  Goal status: INITIAL  2.  The patient will report a 75% improvement in pain levels with functional activities which are currently difficult including walking, standing and stairs Baseline:  Goal status: INITIAL  3.  5x sit to stand improved to 12.5 sec indicating improved quad strength to 4+/5 Baseline:  Goal status: met  4.  The patient will have improved gait stamina and speed needed to ambulate 800 feet in 6 minutes  Baseline:  Goal status: INITIAL  5.  LEFS outcome score improved to  51/80    indicating improved function with less pain Baseline:  Goal status: INITIAL    PLAN:  PT FREQUENCY: 2x/week  PT DURATION: 8 weeks  PLANNED INTERVENTIONS: Therapeutic exercises, Therapeutic activity, Neuromuscular re-education, Balance training, Gait training, Patient/Family education, Self Care, Joint mobilization, Aquatic Therapy, Dry Needling, Electrical stimulation, Cryotherapy, Moist heat, Taping, Vasopneumatic device, Ultrasound, Ionotophoresis /ml Dexamethasone, Manual therapy, and Re-evaluation  PLAN FOR NEXT SESSION: check progress toward goals to determine ERO vs. Discharge;  6 min walk test;  LEFS; aquatic PT  LE strengthening, knee ROM, proprioception, balance;  monitor for excessive fatigue secondary to long covid symptoms  Lavinia Sharps, PT 02/23/23 4:34 PM Phone: [phone] Fax: [phone]

## 2023-02-27 DIAGNOSIS — E559 Vitamin D deficiency, unspecified: Secondary | ICD-10-CM | POA: Diagnosis not present

## 2023-02-27 DIAGNOSIS — F411 Generalized anxiety disorder: Secondary | ICD-10-CM | POA: Diagnosis not present

## 2023-02-27 DIAGNOSIS — G4700 Insomnia, unspecified: Secondary | ICD-10-CM | POA: Diagnosis not present

## 2023-02-27 DIAGNOSIS — R7303 Prediabetes: Secondary | ICD-10-CM | POA: Diagnosis not present

## 2023-02-28 ENCOUNTER — Ambulatory Visit (HOSPITAL_BASED_OUTPATIENT_CLINIC_OR_DEPARTMENT_OTHER): Payer: BC Managed Care – PPO | Admitting: Physical Therapy

## 2023-02-28 ENCOUNTER — Encounter (HOSPITAL_BASED_OUTPATIENT_CLINIC_OR_DEPARTMENT_OTHER): Payer: Self-pay | Admitting: Physical Therapy

## 2023-02-28 DIAGNOSIS — R262 Difficulty in walking, not elsewhere classified: Secondary | ICD-10-CM | POA: Diagnosis not present

## 2023-02-28 DIAGNOSIS — M25661 Stiffness of right knee, not elsewhere classified: Secondary | ICD-10-CM | POA: Diagnosis not present

## 2023-02-28 DIAGNOSIS — M6281 Muscle weakness (generalized): Secondary | ICD-10-CM

## 2023-02-28 DIAGNOSIS — M25662 Stiffness of left knee, not elsewhere classified: Secondary | ICD-10-CM

## 2023-02-28 NOTE — Therapy (Signed)
OUTPATIENT PHYSICAL THERAPY LOWER EXTREMITY TREATMENT   Patient Name: Dana Hamilton MRN: [id number] DOB:08-Feb-1961, 62 y.o., female Today's Date: 02/28/2023  END OF SESSION:  PT End of Session - 02/28/23 1523     Visit Number 14    Date for PT Re-Evaluation 03/09/23    Authorization Type BCBS    PT Start Time 1445    PT Stop Time 1527    PT Time Calculation (min) 42 min    Activity Tolerance Patient tolerated treatment well    Behavior During Therapy WFL for tasks assessed/performed               Past Medical History:  Diagnosis Date   Depression with anxiety    Headache    Heart murmur    History of COVID-19    History of rheumatic fever    Hyperlipidemia    Hypertension    Prediabetes    Past Surgical History:  Procedure Laterality Date   ANKLE FRACTURE SURGERY Right    TONSILLECTOMY     Patient Active Problem List   Diagnosis Date Noted   Chest pain 08/03/2017   Leukocytosis 08/03/2017   Tachycardia 08/03/2017   Hyperglycemia 08/03/2017   Lateral rectus muscle paralysis 05/24/2017   Chronic migraine 05/23/2017    PCP: Maryelizabeth Rowan MD  REFERRING PROVIDER: Tillman Sers FNP  REFERRING DIAG: OA bil knees   THERAPY DIAG:  Knee stiffness; weakness; difficulty in walking  Rationale for Evaluation and Treatment: Rehabilitation  ONSET DATE: December 2023  SUBJECTIVE:   SUBJECTIVE STATEMENT: Pt reports she is leaving for her trip to [place] this week.  She has brought her tape to learn how to tape knee.  She is agreeable to finish aquatic therapy after today's visit.    FROM EVAL: Covid in mid December and now with long covid symptoms; inflammation has worsened OA of knees.  I'm having balance issues too.  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 Yes - when up walking; no pain at rest  NPRS scale: 3/10 Pain location: Rt knee Aggravating factors: walking, up and down stairs one at a time Relieving factors: diclofenac gel; heat; tape   PRECAUTIONS: Fall  WEIGHT BEARING RESTRICTIONS: No  FALLS:  Has patient fallen in last 6 months? Yes. 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; OA of thumb  PATIENT SURVEYS:  LEFS 41/80  COGNITION: Overall cognitive status: Within functional limits for tasks assessed       MUSCLE LENGTH: Hamstrings: Right 70 deg; Left 70 deg  POSTURE: right knee varus   LOWER EXTREMITY ROM: right 3-125 degrees; left 0-125  degrees; able to do 1/3 squat Unable to single leg stand at all on right LE;  left SLS < 3 sec  4/11:Right:  3-131 Left:  0-135 LOWER EXTREMITY MMT: Able to rise sit to stand without UE assist; able to do SLR with mild right quad lag  MMT Right eval Left eval 4/3 4/11   Hip flexion 5 5 5    Hip extension      Hip abduction 4+ 4+ 4+ 4+   Hip adduction      Hip internal rotation      Hip external rotation      Knee flexion 4 4 4+   Knee extension 4- 4- 4- 4  Ankle dorsiflexion      Ankle plantarflexion      Ankle inversion      Ankle eversion       (Blank rows = not tested)   FUNCTIONAL TESTS:   5x sit to stand without UE assist: 14.03 sec TUG 7.26 sec 4/11:   8.70 GAIT: Comments: 3 min walk test 518 feet with HR 60, O2 99% pain rating 3/10  severe varus right knee    TODAY'S TREATMENT:      DATE:  4/30 Pt seen for aquatic therapy today.  Treatment took place in water 3.5-4.75 ft in depth at the [facility] pool. Temp of water was 91.  Pt entered/exited the pool via stairs with step-to pattern with bilat rail.   Walking without support - forward/ backward 3 way RLE stretch with ankle supported by solid noodle Side stepping with added yellow hand floats with addct  Monster walk with cues for neutral Rt foot (vs inverted) forward/ backward Single leg forward lean with UE on noodle x 8 each LE  Single leg yellow noodle stomp (hip flex/knee flex, to Straight leg) x 10 with light UE support on wall  Runners step up at stairs, eccentric slow lowering x 10 each side 3 way straight leg kick without support x 5 each;  3 way lunge x 5 each side Rt hamstring stretch with foot on 3rd step in water Once dried off:  Pt instructed in technique for application  reg Rock tape to Rt medial knee with 25% stretch to decompress tissue and increase proprioception. Pt returned demo with cues.   DATE:  4/25 Nu-step 8 min L3 while discussing status Squats goblet 15# kettlebell to 20 inch box 2 sets of 5, tried set with heels elevated but made no difference in squat depth 30# barbell from 20 inch box height 2 sets of 5  30# barbell dead lifts to mid lower leg 8x Gait practice "up tall" to activate right glutes Hip machine right only 40# 10x each: extension, abduction, hip flexion; too painful to WB on right for left movements Leg press seat 8  100# 12x bil; 40# single leg 12x each right/left (pain on left 3-4/10)  PATIENT EDUCATION:  Education details: exercise  progressions/ modifications  Person educated: Patient Education method: Explanation Education comprehension: verbalized understanding  HOME EXERCISE PROGRAM: Access Code: [email or web address] URL: [email or web address] Date: 01/26/2023 Prepared by: Lavinia Sharps  AQUATIC Access Code: [email or web address]   URL: [email or web address] Date: 02/28/2023 Prepared by: [facility] This aquatic home exercise program from [employer] utilizes pictures from land based exercises, but has been adapted prior to lamination and issuance.    ASSESSMENT:  CLINICAL IMPRESSION: Pt tolerated aquatic exercises well, with occasional burning in Rt pes anserine area; reduced with change in exercise. Pt was issued laminated HEP today. Reviewed these exercises with only minor cues.  PT to ck remaining LTG next visit to assess readiness for d/c.   OBJECTIVE IMPAIRMENTS: decreased activity tolerance, difficulty walking, decreased ROM, decreased strength, impaired perceived functional ability, and pain.   ACTIVITY LIMITATIONS: standing, squatting, stairs, and locomotion level  PARTICIPATION LIMITATIONS: meal prep, cleaning, community activity, occupation, and yard work  PERSONAL FACTORS: 1 comorbidity: long covid  are also affecting patient's functional outcome.   REHAB POTENTIAL: Good  CLINICAL DECISION MAKING: Stable/uncomplicated  EVALUATION COMPLEXITY: Low   GOALS: Goals reviewed with patient? Yes  SHORT TERM GOALS: Target date: 02/09/2023   The patient will demonstrate knowledge of basic self care strategies and exercises to promote healing   Baseline: Goal status:met 4/3  2.  The patient will report a 30% improvement in pain levels with functional activities which are currently difficult including walking, standing, and stairs Baseline:  Goal status: MET - 02/02/23  3.  Bil knee flexion ROM improved to 130 degrees needed for greater ease with stairs to  access office Baseline:  Goal status: met 4/11 4.  Improved quad strength to 4/5 needed for greater ease with stairs and decrease risk of falls Baseline:  Goal status: met 4/11    LONG TERM GOALS: Target date: 03/09/2023    The patient will be independent in a safe self progression of a home exercise program to promote further recovery of function  Baseline:  Goal status: INITIAL  2.  The patient will report a 75% improvement in pain levels with functional activities which are currently difficult including walking, standing and stairs Baseline:  Goal status: INITIAL  3.  5x sit to stand improved to 12.5 sec indicating improved quad strength to 4+/5 Baseline:  Goal status: met  4.  The patient will have improved gait stamina and speed needed to ambulate 800 feet in 6 minutes  Baseline:  Goal status: INITIAL  5.  LEFS outcome score improved to  51/80    indicating improved function with less pain Baseline:  Goal status: INITIAL    PLAN:  PT FREQUENCY: 2x/week  PT DURATION: 8 weeks  PLANNED INTERVENTIONS: Therapeutic exercises, Therapeutic activity, Neuromuscular re-education, Balance training, Gait training, Patient/Family education, Self Care, Joint mobilization, Aquatic Therapy, Dry Needling, Electrical stimulation, Cryotherapy, Moist heat, Taping, Vasopneumatic device, Ultrasound, Ionotophoresis 4mg /ml Dexamethasone, Manual therapy, and Re-evaluation  PLAN FOR NEXT SESSION: see above.   Mayer Camel, PTA 02/28/23 3:28 PM [facility] [address] [place], [place], [address] Phone: [phone]   Fax:  [phone]

## 2023-03-01 ENCOUNTER — Ambulatory Visit: Payer: BC Managed Care – PPO | Attending: Family Medicine | Admitting: Physical Therapy

## 2023-03-01 DIAGNOSIS — M6281 Muscle weakness (generalized): Secondary | ICD-10-CM | POA: Diagnosis not present

## 2023-03-01 DIAGNOSIS — M25661 Stiffness of right knee, not elsewhere classified: Secondary | ICD-10-CM

## 2023-03-01 DIAGNOSIS — M25662 Stiffness of left knee, not elsewhere classified: Secondary | ICD-10-CM | POA: Diagnosis not present

## 2023-03-01 DIAGNOSIS — R262 Difficulty in walking, not elsewhere classified: Secondary | ICD-10-CM | POA: Diagnosis not present

## 2023-03-01 NOTE — Therapy (Signed)
OUTPATIENT PHYSICAL THERAPY LOWER EXTREMITY TREATMENT/RECERTIFICATION  Patient Name: Dana Hamilton MRN: [id number] DOB:[birth year], 62 y.o., female Today's Date: 03/01/2023  END OF SESSION:  PT End of Session - 03/01/23 1440     Visit Number 15    Date for PT Re-Evaluation 04/26/23    Authorization Type BCBS    PT Start Time 1443    PT Stop Time 1522    PT Time Calculation (min) 39 min    Activity Tolerance Patient tolerated treatment well               Past Medical History:  Diagnosis Date   Depression with anxiety    Headache    Heart murmur    History of COVID-19    History of rheumatic fever    Hyperlipidemia    Hypertension    Prediabetes    Past Surgical History:  Procedure Laterality Date   ANKLE FRACTURE SURGERY Right    TONSILLECTOMY     Patient Active Problem List   Diagnosis Date Noted   Chest pain 08/03/2017   Leukocytosis 08/03/2017   Tachycardia 08/03/2017   Hyperglycemia 08/03/2017   Lateral rectus muscle paralysis 05/24/2017   Chronic migraine 05/23/2017    PCP: Maryelizabeth Rowan MD  REFERRING PROVIDER: Tillman Sers FNP  REFERRING DIAG: OA bil knees   THERAPY DIAG:  Knee stiffness; weakness; difficulty in walking  Rationale for Evaluation and Treatment: Rehabilitation  ONSET DATE: December 2023  SUBJECTIVE:   SUBJECTIVE STATEMENT: The last few days have been painful on this knee.   Left 100% right 30% better   FROM EVAL: Covid in mid December and now with long covid symptoms; inflammation has worsened OA of knees.  I'm having balance issues too.  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 Yes - when up walking; no pain at rest  NPRS scale: 3/10 Pain location: Rt knee Aggravating factors: walking, up and down stairs one at a time Relieving factors: diclofenac gel; heat; tape   PRECAUTIONS: Fall  WEIGHT BEARING RESTRICTIONS: No  FALLS:  Has patient fallen in last 6 months? Yes. 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; OA of thumb  PATIENT SURVEYS:  LEFS 41/80  5/1:  46/80  COGNITION: Overall cognitive status: Within functional limits for tasks assessed       MUSCLE LENGTH: Hamstrings: Right 70 deg; Left 70 deg  POSTURE: right knee varus   LOWER EXTREMITY ROM: right 3-125 degrees; left 0-125 degrees; able to do 1/3 squat Unable to single leg stand at all on right LE;  left SLS < 3 sec  4/11:Right:  3-131 Left:  0-135 LOWER EXTREMITY MMT: Able to rise sit to stand without UE assist; able to do SLR with mild right quad lag  MMT Right eval Left eval 4/3 4/11  5/1  Hip flexion 5 5 5     Hip extension       Hip abduction 4+ 4+ 4+ 4+  5  Hip adduction       Hip internal rotation       Hip external rotation       Knee flexion 4 4 4+  Left 5/right 4+  Knee extension 4- 4- 4- 4 Left 4+/right 4  Ankle dorsiflexion       Ankle plantarflexion       Ankle inversion       Ankle eversion        (Blank rows = not tested)   FUNCTIONAL TESTS:  5x sit to stand without UE assist: 14.03 sec TUG 7.26 sec 4/11:    8.70 GAIT: Comments: 3 min walk test 518 feet with HR 60, O2 99% pain rating 3/10  severe varus right knee  5/1:  6 min 1380 feet 6/10 knee pain  TODAY'S TREATMENT:      DATE:  5/1:  Nu-step 5 min L3 while discussing status 6 min walk test Cane vs walking pole for upcoming trip (walking tour) LEFS Discussion of current ex program Plan of care and tapering of visits   4/30 Pt seen for aquatic therapy today.  Treatment took place in water 3.5-4.75 ft in depth at the [facility] pool. Temp of water was 91.  Pt entered/exited the pool via stairs with step-to pattern with bilat rail.   Walking without support - forward/ backward 3 way RLE stretch with ankle supported by solid noodle Side stepping with added yellow hand floats with addct  Monster walk with cues for neutral Rt foot (vs inverted) forward/ backward Single leg forward lean with UE on noodle x 8 each LE  Single leg yellow noodle stomp (hip flex/knee flex, to Straight leg) x 10 with light UE support on wall  Runners step up at stairs, eccentric slow lowering x 10 each side 3 way straight leg kick without support x 5 each;  3 way lunge x 5 each side Rt hamstring stretch with foot on 3rd step in water Once dried off:  Pt instructed in technique for application  reg Rock tape to Rt medial knee with 25% stretch to decompress tissue and increase proprioception. Pt returned demo with cues.   DATE:  4/25 Nu-step 8 min L3 while discussing status Squats goblet 15# kettlebell to 20 inch box 2 sets of 5, tried set with heels elevated but made no difference in squat depth 30# barbell from 20 inch box height 2 sets of 5  30# barbell dead lifts to mid lower leg 8x Gait practice "up tall" to activate right glutes Hip machine right only 40# 10x each: extension, abduction, hip flexion; too painful to WB on right for left movements Leg press seat 8  100# 12x bil; 40# single leg 12x each right/left (pain on left 3-4/10)  PATIENT EDUCATION:  Education details: exercise progressions/ modifications  Person educated: Patient Education method: Explanation Education comprehension: verbalized understanding  HOME EXERCISE PROGRAM: Access Code: [email or web address] URL: [email or web address] Date: 01/26/2023 Prepared by: Lavinia Sharps  AQUATIC Access Code: [email or web address]   URL: [email or web address] Date: 02/28/2023 Prepared by: [facility] - Outpatient Rehab - [address] This aquatic home exercise program from Med[employer] utilizes pictures from land based exercises, but has been adapted prior to lamination and issuance.    ASSESSMENT:  CLINICAL IMPRESSION: Progressing with rehab goals.  Significant improvement in left knee pain (rates at 100% better) however right knee pain about 30% better.  + right varus knee deformity with pain level 6/10 with 6 min walk test.  Recommend use of single arm support walking pole or single point cane for walking tour on upcoming trip or in the airport.  Patient is highly compliant with HEP/gym program.  She requests a follow up to discuss how she's doing following her trip and with her gym program.  Anticipate readiness for discharge in 1-2 visits.    OBJECTIVE IMPAIRMENTS: decreased activity tolerance, difficulty walking, decreased ROM, decreased strength, impaired perceived functional ability, and pain.   ACTIVITY LIMITATIONS: standing, squatting, stairs, and locomotion level  PARTICIPATION LIMITATIONS: meal prep, cleaning, community activity, occupation, and yard work  PERSONAL FACTORS: 1 comorbidity: long covid  are also affecting patient's functional outcome.   REHAB POTENTIAL: Good  CLINICAL DECISION MAKING: Stable/uncomplicated  EVALUATION COMPLEXITY: Low   GOALS: Goals reviewed with patient? Yes  SHORT TERM GOALS: Target date:  02/09/2023   The patient will demonstrate knowledge of basic self care strategies and exercises to promote healing   Baseline: Goal status:met 4/3  2.  The patient will report a 30% improvement in pain levels with functional activities which are currently difficult including walking, standing, and stairs Baseline:  Goal status: MET - 02/02/23  3.  Bil knee flexion ROM improved to 130 degrees needed for greater ease with stairs to access office Baseline:  Goal status: met 4/11 4.  Improved quad strength to 4/5 needed for greater ease with stairs and decrease risk of falls Baseline:  Goal status: met 4/11    LONG TERM GOALS: Target date: 04/26/2023    The patient will be independent in a safe self progression of a home exercise program to promote further recovery of function  Baseline:  Goal status: ongoing  2.  The patient will report a 75% improvement in pain levels with functional activities which are currently difficult including walking, standing and stairs Baseline:  Goal status: partially met for left knee 5/1  3.  5x sit to stand improved to 12.5 sec indicating improved quad strength to 4+/5 Baseline:  Goal status: met  4.  The patient will have improved gait stamina and speed needed to ambulate 800 feet in 6 minutes  Baseline:  Goal status: met 5/1  5.  LEFS outcome score improved to  51/80    indicating improved function with less pain Baseline:  Goal status: ongoing   PLAN:  PT FREQUENCY: biweekly PT DURATION: 8 weeks  PLANNED INTERVENTIONS: Therapeutic exercises, Therapeutic activity, Neuromuscular re-education, Balance training, Gait training, Patient/Family education, Self Care, Joint mobilization, Aquatic Therapy, Dry Needling, Electrical stimulation, Cryotherapy, Moist heat, Taping, Vasopneumatic device, Ultrasound, Ionotophoresis 4mg /ml Dexamethasone, Manual therapy, and Re-evaluation  PLAN FOR NEXT SESSION: follow up in 3-4 weeks after her trip and to  check in on how she's doing with home/gym program; pt considering purchase of cane for long distance ambulation  Lavinia Sharps, PT 03/01/23 4:28 PM Phone: [phone] Fax: [phone]

## 2023-03-24 ENCOUNTER — Ambulatory Visit: Payer: BC Managed Care – PPO | Admitting: Physical Therapy

## 2023-03-31 ENCOUNTER — Ambulatory Visit: Payer: BC Managed Care – PPO | Admitting: Physical Therapy

## 2023-03-31 DIAGNOSIS — M25661 Stiffness of right knee, not elsewhere classified: Secondary | ICD-10-CM

## 2023-03-31 DIAGNOSIS — R262 Difficulty in walking, not elsewhere classified: Secondary | ICD-10-CM | POA: Diagnosis not present

## 2023-03-31 DIAGNOSIS — M6281 Muscle weakness (generalized): Secondary | ICD-10-CM | POA: Diagnosis not present

## 2023-03-31 DIAGNOSIS — M25662 Stiffness of left knee, not elsewhere classified: Secondary | ICD-10-CM | POA: Diagnosis not present

## 2023-03-31 NOTE — Therapy (Signed)
OUTPATIENT PHYSICAL THERAPY LOWER EXTREMITY TREATMENT/DISCHARGE SUMMARY  Patient Name: Dana Hamilton MRN: [id number] DOB:1961-01-24, 62 y.o., female Today's Date: 03/01/2023  END OF SESSION:  PT End of Session - 03/01/23 1440     Visit Number 15    Date for PT Re-Evaluation 04/26/23    Authorization Type BCBS    PT Start Time 1443    PT Stop Time 1522    PT Time Calculation (min) 39 min    Activity Tolerance Patient tolerated treatment well               Past Medical History:  Diagnosis Date   Depression with anxiety    Headache    Heart murmur    History of COVID-19    History of rheumatic fever    Hyperlipidemia    Hypertension    Prediabetes    Past Surgical History:  Procedure Laterality Date   ANKLE FRACTURE SURGERY Right    TONSILLECTOMY     Patient Active Problem List   Diagnosis Date Noted   Chest pain 08/03/2017   Leukocytosis 08/03/2017   Tachycardia 08/03/2017   Hyperglycemia 08/03/2017   Lateral rectus muscle paralysis 05/24/2017   Chronic migraine 05/23/2017    PCP: Maryelizabeth Rowan MD  REFERRING PROVIDER: Tillman Sers FNP  REFERRING DIAG: OA bil knees   THERAPY DIAG:  Knee stiffness; weakness; difficulty in walking  Rationale for Evaluation and Treatment: Rehabilitation  ONSET DATE: December 2023  SUBJECTIVE:   SUBJECTIVE STATEMENT: Trip went well and cane was helpful.  There was a lot of walking.  2 more weeks of Right Start.  Gardening I still have pain.   FROM EVAL: Covid in mid December and now with long covid symptoms; inflammation has worsened OA of knees.  I'm having balance issues too.  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 Yes - when up walking; no pain at rest  NPRS scale: 3/10 Pain location: Rt knee Aggravating factors: walking, up and down stairs one at a time Relieving factors: diclofenac gel; heat; tape   PRECAUTIONS: Fall  WEIGHT BEARING RESTRICTIONS: No  FALLS:  Has patient fallen in last 6 months? Yes. 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; OA of thumb  PATIENT SURVEYS:  LEFS 41/80  5/1:  46/80  5/31: 56/80  COGNITION: Overall cognitive status: Within functional limits for tasks assessed       MUSCLE LENGTH: Hamstrings: Right 70 deg; Left 70 deg  POSTURE: right knee varus   LOWER EXTREMITY ROM: right 3-125 degrees; left 0-125 degrees; able to do 1/3 squat Unable to single leg stand at all  on right LE;  left SLS < 3 sec 5/31:  Right 3 sec   4/11:Right:  3-131 Left:  0-135 5/31: right 3-131, left 0-135  LOWER EXTREMITY MMT: Able to rise sit to stand without UE assist; able to do SLR with mild right quad lag  MMT Right eval Left eval 4/3 4/11  5/1 5/31  Hip flexion 5 5 5   5   Hip extension      5  Hip abduction 4+ 4+ 4+ 4+  5 5  Hip adduction        Hip internal rotation        Hip external rotation        Knee flexion 4 4 4+  Left 5/right 4+ 5 bil  Knee extension 4- 4- 4- 4 Left 4+/right 4 Right 4+ Left 5  Ankle dorsiflexion        Ankle plantarflexion        Ankle inversion         Ankle eversion         (Blank rows = not tested)   FUNCTIONAL TESTS:  5x sit to stand without UE assist: 14.03 sec TUG 7.26 sec 4/11:   8.70 GAIT: Comments: 3 min walk test 518 feet with HR 60, O2 99% pain rating 3/10  severe varus right knee  5/1:  6 min 1380 feet 6/10 knee pain  TODAY'S TREATMENT:      DATE: 5/30: Nu-Step L5  12 min while discussing current ex program and current status Patient education on volume of ex at [facility] program that is sustainable (cardio, arm push, arm pull, abdominals, leg push, leg pull) Stretching routine: calf stretch on wall, HS stretch seated or supine with Stretchout strap;  prone quad stretch with strap (added to HEP) LEFS Hip machine: 40# 3 ways right/left (patient able to stand with ease on right LE now)      5/1:  Nu-step 5 min L3 while discussing status 6 min walk test Cane vs walking pole for upcoming trip (walking tour) LEFS Discussion of current ex program Plan of care and tapering of visits   4/30 Pt seen for aquatic therapy today.  Treatment took place in water 3.5-4.75 ft in depth at the [facility] pool. Temp of water was 91.  Pt entered/exited the pool via stairs with step-to pattern with bilat rail.   Walking without support - forward/ backward 3 way RLE stretch with ankle supported by solid noodle Side stepping with added yellow hand floats with addct  Monster walk with cues for neutral Rt foot (vs inverted) forward/ backward Single leg forward lean with UE on noodle x 8 each LE  Single leg yellow noodle stomp (hip flex/knee flex, to Straight leg) x 10 with light UE support on wall  Runners step up at stairs, eccentric slow lowering x 10 each side 3 way straight leg kick without support x 5 each;  3 way lunge x 5 each side Rt hamstring stretch with foot on 3rd step in water Once dried off:  Pt instructed in technique for application  reg Rock tape to Rt medial knee with 25% stretch to decompress tissue and  increase proprioception. Pt returned demo with cues.  PATIENT EDUCATION:  Education details: exercise progressions/ modifications  Person educated: Patient Education method: Explanation Education comprehension: verbalized understanding  HOME EXERCISE PROGRAM: Access Code: [email or web address] URL: [email or web address] Date: 03/31/2023 Prepared by: Lavinia Sharps  Exercises - Seated Long Arc Quad  - 1 x daily - 7 x weekly - 1 sets - 10 reps - Sit to Stand  - 1 x daily - 7 x weekly - 1 sets - 10 reps - Clamshell  - 1 x daily - 7 x weekly - 1 sets - 10 reps - Supine Bilateral Hamstring Sets  - 1 x daily - 7 x weekly - 1 sets - 10 reps - 5 hold - Supine Short Arc Quad  - 1 x daily - 7 x weekly - 1 sets - 10 reps - 5 hold - Supine Bridge  - 1 x daily - 7 x weekly - 1 sets - 10 reps - Seated Active Assistive Knee Flexion and Extension  - 1 x daily - 7 x weekly - 1 sets - 10 reps - Seated Knee Flexion  - 1 x daily - 7 x weekly - 1 sets - 10 reps - Clamshell with Resistance  - 1 x daily - 7 x weekly - 1 sets - 10 reps - Seated Leg Press with Resistance  - 1 x daily - 7 x weekly - 1 sets - 10 reps - Deadlift With Dumbbells  - 1 x daily - 7 x weekly - 3 sets - 5 reps - Goblet Squat with Kettlebell  - 1 x daily - 7 x weekly - 2 sets - 5 reps - Prone Quadriceps Stretch with Strap  - 1 x daily - 7 x weekly - 1 sets - 3 reps - 30 hold - Hooklying Hamstring Stretch with Strap  - 1 x daily - 7 x weekly - 1 sets - 3 reps - 30 hold  AQUATIC Access Code: [email or web address]   URL: [email or web address] Date: 02/28/2023 Prepared by: [facility] - Outpatient Rehab - [address] This aquatic home exercise program from [employer] utilizes pictures from land based exercises, but has been adapted prior to lamination and issuance.    ASSESSMENT:  CLINICAL IMPRESSION: The  patient has met rehab goals, with noted improvements in pain reduction, outcome score, ROM, strength and functional mobility.  She is highly compliant with a gym program and a comprehensive HEP has been established.   I anticipate further improvements over time with regular performance of the program.  Recommend discharge from PT at this time.    OBJECTIVE IMPAIRMENTS: decreased activity tolerance, difficulty walking, decreased ROM, decreased strength, impaired perceived functional ability, and pain.   ACTIVITY LIMITATIONS: standing, squatting, stairs, and locomotion level  PARTICIPATION LIMITATIONS: meal prep, cleaning, community activity, occupation, and yard work  PERSONAL FACTORS: 1 comorbidity: long covid  are also affecting patient's functional outcome.   REHAB POTENTIAL: Good  CLINICAL DECISION MAKING: Stable/uncomplicated  EVALUATION COMPLEXITY: Low   GOALS: Goals reviewed with patient? Yes  SHORT TERM GOALS: Target date: 02/09/2023   The patient will demonstrate knowledge of basic self care strategies and exercises to promote healing   Baseline: Goal status:met 4/3  2.  The patient will report a 30% improvement in pain levels with functional activities which are currently difficult including walking, standing, and stairs Baseline:  Goal status: MET - 02/02/23  3.  Bil knee flexion ROM improved to 130 degrees needed for greater ease with stairs to access office Baseline:  Goal status: met 4/11 4.  Improved quad strength to 4/5 needed for greater ease with stairs and decrease risk of falls Baseline:  Goal status: met 4/11    LONG TERM GOALS: Target date: 04/26/2023    The patient will be independent in a safe self progression of a home exercise program to promote further recovery of function  Baseline:  Goal status: met  2.  The patient will report a 75% improvement in pain levels with functional activities which are currently difficult including walking, standing  and stairs Baseline:  Goal status: partially met for left knee 5/1  3.  5x sit to stand improved to 12.5 sec indicating improved quad strength to 4+/5 Baseline:  Goal status: met  4.  The patient will have improved gait stamina and speed needed to ambulate 800 feet in 6 minutes  Baseline:  Goal status: met 5/1  5.  LEFS outcome score improved to  51/80    indicating improved function with less pain Baseline:  Goal status: met 5/31   PLAN:  PHYSICAL THERAPY DISCHARGE SUMMARY  Visits from Start of Care: 16  Current functional level related to goals / functional outcomes: See clinical impressions above   Remaining deficits: As above   Education / Equipment: Comprehensive HEP   Patient agrees to discharge. Patient goals were met. Patient is being discharged due to meeting the stated rehab goals.  Lavinia Sharps, PT 03/31/23 11:39 AM Phone: [phone] Fax: [phone]

## 2023-04-05 DIAGNOSIS — U099 Post covid-19 condition, unspecified: Secondary | ICD-10-CM | POA: Diagnosis not present

## 2023-04-05 DIAGNOSIS — R413 Other amnesia: Secondary | ICD-10-CM | POA: Diagnosis not present

## 2023-04-05 DIAGNOSIS — R4189 Other symptoms and signs involving cognitive functions and awareness: Secondary | ICD-10-CM | POA: Diagnosis not present

## 2023-04-05 DIAGNOSIS — M1990 Unspecified osteoarthritis, unspecified site: Secondary | ICD-10-CM | POA: Diagnosis not present

## 2023-04-25 DIAGNOSIS — M25562 Pain in left knee: Secondary | ICD-10-CM | POA: Diagnosis not present

## 2023-04-25 DIAGNOSIS — M25561 Pain in right knee: Secondary | ICD-10-CM | POA: Diagnosis not present

## 2023-05-09 DIAGNOSIS — Z0000 Encounter for general adult medical examination without abnormal findings: Secondary | ICD-10-CM | POA: Diagnosis not present

## 2023-05-09 DIAGNOSIS — Z13220 Encounter for screening for lipoid disorders: Secondary | ICD-10-CM | POA: Diagnosis not present

## 2023-05-09 DIAGNOSIS — Z114 Encounter for screening for human immunodeficiency virus [HIV]: Secondary | ICD-10-CM | POA: Diagnosis not present

## 2023-05-15 ENCOUNTER — Other Ambulatory Visit: Payer: Self-pay | Admitting: Family Medicine

## 2023-05-15 DIAGNOSIS — Z01419 Encounter for gynecological examination (general) (routine) without abnormal findings: Secondary | ICD-10-CM | POA: Diagnosis not present

## 2023-05-15 DIAGNOSIS — Z0000 Encounter for general adult medical examination without abnormal findings: Secondary | ICD-10-CM | POA: Diagnosis not present

## 2023-05-15 DIAGNOSIS — Z118 Encounter for screening for other infectious and parasitic diseases: Secondary | ICD-10-CM | POA: Diagnosis not present

## 2023-05-15 DIAGNOSIS — Z1231 Encounter for screening mammogram for malignant neoplasm of breast: Secondary | ICD-10-CM

## 2023-05-15 DIAGNOSIS — N952 Postmenopausal atrophic vaginitis: Secondary | ICD-10-CM | POA: Diagnosis not present

## 2023-05-15 DIAGNOSIS — Z23 Encounter for immunization: Secondary | ICD-10-CM | POA: Diagnosis not present

## 2023-05-18 DIAGNOSIS — L814 Other melanin hyperpigmentation: Secondary | ICD-10-CM | POA: Diagnosis not present

## 2023-05-18 DIAGNOSIS — D1801 Hemangioma of skin and subcutaneous tissue: Secondary | ICD-10-CM | POA: Diagnosis not present

## 2023-05-18 DIAGNOSIS — L821 Other seborrheic keratosis: Secondary | ICD-10-CM | POA: Diagnosis not present

## 2023-05-18 DIAGNOSIS — L578 Other skin changes due to chronic exposure to nonionizing radiation: Secondary | ICD-10-CM | POA: Diagnosis not present

## 2023-06-13 ENCOUNTER — Ambulatory Visit: Admission: RE | Admit: 2023-06-13 | Payer: BC Managed Care – PPO | Source: Ambulatory Visit

## 2023-06-13 DIAGNOSIS — Z1231 Encounter for screening mammogram for malignant neoplasm of breast: Secondary | ICD-10-CM | POA: Diagnosis not present

## 2023-06-15 DIAGNOSIS — F411 Generalized anxiety disorder: Secondary | ICD-10-CM | POA: Diagnosis not present

## 2023-06-15 DIAGNOSIS — G4700 Insomnia, unspecified: Secondary | ICD-10-CM | POA: Diagnosis not present

## 2023-06-15 DIAGNOSIS — F331 Major depressive disorder, recurrent, moderate: Secondary | ICD-10-CM | POA: Diagnosis not present

## 2023-06-15 DIAGNOSIS — R4189 Other symptoms and signs involving cognitive functions and awareness: Secondary | ICD-10-CM | POA: Diagnosis not present

## 2023-06-15 DIAGNOSIS — Z23 Encounter for immunization: Secondary | ICD-10-CM | POA: Diagnosis not present

## 2023-07-10 DIAGNOSIS — N952 Postmenopausal atrophic vaginitis: Secondary | ICD-10-CM | POA: Diagnosis not present

## 2023-07-10 DIAGNOSIS — U099 Post covid-19 condition, unspecified: Secondary | ICD-10-CM | POA: Diagnosis not present

## 2023-07-10 DIAGNOSIS — I10 Essential (primary) hypertension: Secondary | ICD-10-CM | POA: Diagnosis not present

## 2023-07-26 DIAGNOSIS — M1711 Unilateral primary osteoarthritis, right knee: Secondary | ICD-10-CM | POA: Diagnosis not present

## 2023-07-28 ENCOUNTER — Ambulatory Visit: Payer: BC Managed Care – PPO | Admitting: Cardiology

## 2023-08-01 ENCOUNTER — Ambulatory Visit (INDEPENDENT_AMBULATORY_CARE_PROVIDER_SITE_OTHER): Payer: BC Managed Care – PPO

## 2023-08-01 ENCOUNTER — Ambulatory Visit: Payer: BC Managed Care – PPO | Attending: Cardiology | Admitting: Cardiology

## 2023-08-01 ENCOUNTER — Encounter: Payer: Self-pay | Admitting: Cardiology

## 2023-08-01 VITALS — BP 122/74 | HR 66 | Resp 16 | Ht 66.0 in | Wt 228.0 lb

## 2023-08-01 DIAGNOSIS — I10 Essential (primary) hypertension: Secondary | ICD-10-CM | POA: Diagnosis not present

## 2023-08-01 DIAGNOSIS — R002 Palpitations: Secondary | ICD-10-CM | POA: Diagnosis not present

## 2023-08-01 DIAGNOSIS — I061 Rheumatic aortic insufficiency: Secondary | ICD-10-CM | POA: Diagnosis not present

## 2023-08-01 DIAGNOSIS — E782 Mixed hyperlipidemia: Secondary | ICD-10-CM

## 2023-08-01 DIAGNOSIS — Z8679 Personal history of other diseases of the circulatory system: Secondary | ICD-10-CM

## 2023-08-01 DIAGNOSIS — E6609 Other obesity due to excess calories: Secondary | ICD-10-CM

## 2023-08-01 DIAGNOSIS — E66812 Obesity, class 2: Secondary | ICD-10-CM

## 2023-08-01 DIAGNOSIS — Z87891 Personal history of nicotine dependence: Secondary | ICD-10-CM

## 2023-08-01 DIAGNOSIS — Z6836 Body mass index (BMI) 36.0-36.9, adult: Secondary | ICD-10-CM

## 2023-08-01 NOTE — Progress Notes (Unsigned)
Enrolled for [employer] to mail a ZIO XT long term holter monitor to the patients address on file.  

## 2023-08-01 NOTE — Patient Instructions (Addendum)
Medication Instructions:  Your physician recommends that you continue on your current medications as directed. Please refer to the Current Medication list given to you today.  *If you need a refill on your cardiac medications before your next appointment, please call your pharmacy*   Lab Work: None.  If you have labs (blood work) drawn today and your tests are completely normal, you will receive your results only by: MyChart Message (if you have MyChart) OR A paper copy in the mail If you have any lab test that is abnormal or we need to change your treatment, we will call you to review the results.   Testing/Procedures: Your physician has requested that you have an echocardiogram IN ONE YEAR. Echocardiography is a painless test that uses sound waves to create images of your heart. It provides your doctor with information about the size and shape of your heart and how well your heart's chambers and valves are working. This procedure takes approximately one hour. There are no restrictions for this procedure. Please do NOT wear cologne, perfume, aftershave, or lotions (deodorant is allowed). Please arrive 15 minutes prior to your appointment time.  ZIO XT- Long Term Monitor Instructions  Your physician has requested you wear a ZIO patch monitor for 14 days.  This is a single patch monitor. [employer] supplies one patch monitor per enrollment. Additional stickers are not available. Please do not apply patch if you will be having a Nuclear Stress Test,  Echocardiogram, Cardiac CT, MRI, or Chest Xray during the period you would be wearing the  monitor. The patch cannot be worn during these tests. You cannot remove and re-apply the  ZIO XT patch monitor.  Your ZIO patch monitor will be mailed 3 day USPS to your address on file. It may take 3-5 days  to receive your monitor after you have been enrolled.  Once you have received your monitor, please review the enclosed instructions. Your monitor   has already been registered assigning a specific monitor serial # to you.  Billing and Patient Assistance Program Information  We have supplied [employer] with any of your insurance information on file for billing purposes. [employer] offers a sliding scale Patient Assistance Program for patients that do not have  insurance, or whose insurance does not completely cover the cost of the ZIO monitor.  You must apply for the Patient Assistance Program to qualify for this discounted rate.  To apply, please call [employer] at [phone], select option 4, select option 2, ask to apply for  Patient Assistance Program. Meredeth Ide will ask your household income, and how many people  are in your household. They will quote your out-of-pocket cost based on that information.  [employer] will also be able to set up a 44-month, interest-free payment plan if needed.  Applying the monitor   Shave hair from upper left chest.  Hold abrader disc by orange tab. Rub abrader in 40 strokes over the upper left chest as  indicated in your monitor instructions.  Clean area with 4 enclosed alcohol pads. Let dry.  Apply patch as indicated in monitor instructions. Patch will be placed under collarbone on left  side of chest with arrow pointing upward.  Rub patch adhesive wings for 2 minutes. Remove white label marked "1". Remove the white  label marked "2". Rub patch adhesive wings for 2 additional minutes.  While looking in a mirror, press and release button in center of patch. A small green light will  flash 3-4 times. This will be  your only indicator that the monitor has been turned on.  Do not shower for the first 24 hours. You may shower after the first 24 hours.  Press the button if you feel a symptom. You will hear a small click. Record Date, Time and  Symptom in the Patient Logbook.  When you are ready to remove the patch, follow instructions on the last 2 pages of Patient  Logbook. Stick patch monitor onto the last page  of Patient Logbook.  Place Patient Logbook in the blue and white box. Use locking tab on box and tape box closed  securely. The blue and white box has prepaid postage on it. Please place it in the mailbox as  soon as possible. Your physician should have your test results approximately 7 days after the  monitor has been mailed back to [facility].  Call [facility] Customer Care at [phone] if you have questions regarding  your ZIO XT patch monitor. Call them immediately if you see an orange light blinking on your  monitor.  If your monitor falls off in less than 4 days, contact our Monitor department at [phone].  If your monitor becomes loose or falls off after 4 days call [employer] at [phone] for  suggestions on securing your monitor    Follow-Up:  Your next appointment:   1 year(s) (or sooner if monitor results are abnormal)  Provider:   Tessa Lerner, DO

## 2023-08-01 NOTE — Progress Notes (Signed)
Cardiology Office Note:  .   Date:  08/01/2023  ID:  Dana Hamilton, DOB 1961-07-20, MRN [id number] PCP:  Lewis Moccasin, MD  [facility] Providers Cardiologist:  Tessa Lerner, DO , [facility] (established care 05/2021) Electrophysiologist:  None  Click to update primary MD,subspecialty MD or APP then REFRESH:1}    History of Present Illness: .   Dana Hamilton is a 62 y.o. Caucasian female whose past medical history and cardiovascular risk factors includes:  Aortic regurgitation, hypertension, hyperlipidemia, prediabetes, insomnia, general anxiety disorder, postmenopausal female, history of rheumatic fever, history of COVID-19 infection x2, obesity due to excess calories, former smoker.   Patient is being followed by the practice for management of aortic regurgitation and mitral regurgitation.  She presents today for a 1 year follow-up visit.  Over the last 1 year patient denies anginal chest pain or heart failure symptoms.  However has been battling long COVID-like symptoms which are now tapering.  Overall functional capacity remains stable-currently goes to [employer] and does 30 minutes of cardiovascular workout on a regular basis.  Intermittently experiences palpitations: Couple times a week Secondary to long COVID. Lasting for few minutes. No change in intensity frequency or duration. More noticeable at night when she is trying to go to sleep. No near-syncope or syncopal events  Review of Systems: .   Review of Systems  Cardiovascular:  Positive for palpitations. Negative for chest pain, claudication, dyspnea on exertion, irregular heartbeat, leg swelling, near-syncope, orthopnea, paroxysmal nocturnal dyspnea and syncope.  Respiratory:  Negative for shortness of breath.   Hematologic/Lymphatic: Negative for bleeding problem.  Musculoskeletal:  Negative for muscle cramps and myalgias.  Neurological:  Negative for dizziness and light-headedness.    Studies Reviewed:    EKG: The ekg ordered today was personally reviewed by me.  EKG Interpretation Date/Time:  Tuesday August 01 2023 10:30:03 EDT Ventricular Rate:  66 PR Interval:  168 QRS Duration:  118 QT Interval:  414 QTC Calculation: 434 R Axis:   48  Text Interpretation: Normal sinus rhythm Incomplete right bundle branch block When compared with ECG of 03-Aug-2017 16:01, PREVIOUS ECG IS PRESENT Confirmed by Odis Hollingshead, Altie Savard ([id number]) on 08/01/2023 10:44:01 AM  Echocardiogram: 07/12/2022: Normal LV systolic function with visual EF 60-65%. Left ventricle cavity is normal in size. Mild concentric hypertrophy of the left ventricle. Normal global wall motion. Doppler evidence of grade I (impaired) diastolic dysfunction, normal LAP.  Trileaflet aortic valve. Mild to moderate aortic regurgitation. Sclerosis of the aortic valve. Structurally normal mitral valve. Mild (Grade I) mitral regurgitation. E-wave dominant mitral inflow. Structurally normal tricuspid valve with trace regurgitation. No evidence of pulmonary hypertension. No significant change compared to 2022.   Stress Testing: No results found for this or any previous visit from the past 1095 days.   Heart Catheterization: None  Risk Assessment/Calculations:    None   Labs:       Latest Ref Rng & Units 08/04/2017    2:52 AM 08/03/2017    4:12 PM  CBC  WBC 4.0 - 10.5 K/uL 12.4  18.0   Hemoglobin 12.0 - 15.0 g/dL 78.2  95.6   Hematocrit 36.0 - 46.0 % 38.4  42.8   Platelets 150 - 400 K/uL 290  316        Latest Ref Rng & Units 08/04/2017    2:52 AM 08/03/2017    4:12 PM  BMP  Glucose 65 - 99 mg/dL 213  086   BUN 6 - 20 mg/dL 20  19  Creatinine 0.44 - 1.00 mg/dL 1.61  0.96   Sodium 045 - 145 mmol/L 137  135   Potassium 3.5 - 5.1 mmol/L 3.6  4.5   Chloride 101 - 111 mmol/L 101  100   CO2 22 - 32 mmol/L 27  26   Calcium 8.9 - 10.3 mg/dL 8.9  9.3       Latest Ref Rng & Units 08/04/2017    2:52 AM 08/03/2017    4:12 PM  CMP  Glucose 65  - 99 mg/dL 409  811   BUN 6 - 20 mg/dL 20  19   Creatinine 9.14 - 1.00 mg/dL 7.82  9.56   Sodium 213 - 145 mmol/L 137  135   Potassium 3.5 - 5.1 mmol/L 3.6  4.5   Chloride 101 - 111 mmol/L 101  100   CO2 22 - 32 mmol/L 27  26   Calcium 8.9 - 10.3 mg/dL 8.9  9.3   Total Protein 6.5 - 8.1 g/dL 6.3    Total Bilirubin 0.3 - 1.2 mg/dL 0.8    Alkaline Phos 38 - 126 U/L 58    AST 15 - 41 U/L 15    ALT 14 - 54 U/L 16      Lab Results  Component Value Date   CHOL 145 08/04/2017   HDL 48 08/04/2017   LDLCALC 89 08/04/2017   TRIG 42 08/04/2017   CHOLHDL 3.0 08/04/2017   No results for input(s): "LIPOA" in the last 8760 hours. No components found for: "NTPROBNP" No results for input(s): "PROBNP" in the last 8760 hours. No results for input(s): "TSH" in the last 8760 hours.   Physical Exam:    Today's Vitals   08/01/23 1026  BP: 122/74  Pulse: 66  Resp: 16  SpO2: 95%  Weight: 228 lb (103.4 kg)  Height: 5\' 6"  (1.676 m)   Body mass index is 36.8 kg/m. Wt Readings from Last 3 Encounters:  08/01/23 228 lb (103.4 kg)  08/25/22 224 lb (101.6 kg)  08/08/22 224 lb (101.6 kg)    Physical Exam  Constitutional: No distress.  Age appropriate, hemodynamically stable.   Neck: No JVD present.  Cardiovascular: Normal rate, regular rhythm, S1 normal, S2 normal, intact distal pulses and normal pulses. Exam reveals no gallop, no S3 and no S4.  Murmur heard. Blowing decrescendo early diastolic murmur is present with a grade of 3/4 at the upper right sternal border radiating to the apex. Pulses:      Dorsalis pedis pulses are 2+ on the right side and 2+ on the left side.       Posterior tibial pulses are 2+ on the right side and 2+ on the left side.  Pulmonary/Chest: Effort normal and breath sounds normal. No stridor. She has no wheezes. She has no rales.  Abdominal: Soft. Bowel sounds are normal. She exhibits no distension. There is no abdominal tenderness.  Musculoskeletal:         General: No edema.     Cervical back: Neck supple.  Neurological: She is alert and oriented to person, place, and time. She has intact cranial nerves (2-12).  Skin: Skin is warm and moist.     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 History of rheumatic fever  Z86.79 ECHOCARDIOGRAM COMPLETE    3. Benign hypertension  I10     4. Mixed hyperlipidemia  E78.2     5. Former smoker  Z87.891     6. Class 2 obesity due to excess calories without serious comorbidity with body mass index (BMI) of 36.0 to 36.9 in adult  E66.812    E66.09    Z68.36     7. Palpitations  R00.2 LONG TERM MONITOR (3-14 DAYS)       Recommendation(s):  Rheumatic aortic valve insufficiency History of rheumatic fever Currently remains asymptomatic. Last echo from September 2023 notes mild to moderate AR. Plan echo prior to the next office visit for follow-up. Reemphasized importance of blood pressure management  Benign hypertension Office blood pressures are well-controlled on current medical therapy. Continue ARB and HCTZ.  Mixed hyperlipidemia Currently on rosuvastatin.   She denies myalgia or other side effects. Had labs with PCP.  Not available in Care Everywhere.  Patient will provide a copy for reference.  Class 2 obesity due to excess calories without serious comorbidity with body mass index (BMI) of 36.0 to 36.9 in adult Body mass index is 36.8 kg/m. I reviewed with her importance of diet, regular physical activity/exercise, weight loss.   Patient is educated on the importance of increasing physical activity gradually as tolerated with a goal of moderate intensity exercise for 30 minutes a day 5 days a week.   Since last office visit she has had long COVID symptoms and was seen at [facility].  She was experiencing intermittent palpitations overall intensity frequency and duration all  improving.  But the shared decision is to proceed with 2-week Zio patch to evaluate for dysrhythmias.  Orders Placed:  Orders Placed This Encounter  Procedures   LONG TERM MONITOR (3-14 DAYS)    Standing Status:   Future    Number of Occurrences:   1    Order Specific Question:   Where should this test be performed?    Answer:   [facility]    Order Specific Question:   Does the patient have an implanted cardiac device?    Answer:   No    Order Specific Question:   Prescribed days of wear    Answer:   55    Order Specific Question:   Type of enrollment    Answer:   Home Enrollment    Order Specific Question:   Vendor:    Answer:   Z[employer]   EKG 12-Lead   ECHOCARDIOGRAM COMPLETE    Standing Status:   Future    Order Specific Question:   Where should this test be performed    Answer:   [facility])    Order Specific Question:   Does the patient weigh less than or greater than 250 lbs?    Answer:   Patient weighs less than 250 lbs    Order Specific Question:   Perflutren DEFINITY (image enhancing agent) should be administered unless hypersensitivity or allergy exist    Answer:   Administer Perflutren    Order Specific Question:   Reason for exam-Echo    Answer:   Aortic regurgitation I35.1    As part of medical decision making results of the echo were reviewed independently at today's visit.   Final Medication List:   No orders of the defined types were placed in this encounter.   Medications Discontinued During This Encounter  Medication Reason   diphenhydrAMINE (BENADRYL) 25 mg capsule    escitalopram (LEXAPRO) 20 MG tablet    ibuprofen (ADVIL,MOTRIN) 200 MG tablet    valsartan (  DIOVAN) 160 MG tablet      Current Outpatient Medications:    ALPRAZolam (XANAX) 0.25 MG tablet, Take 0.25 mg by mouth at bedtime as needed., Disp: , Rfl:    buPROPion (WELLBUTRIN XL) 300 MG 24 hr tablet, Take 300 mg by mouth daily., Disp: , Rfl:    Cholecalciferol (VITAMIN  D3) 2000 units capsule, Take 2,000 Units by mouth daily., Disp: , Rfl:    Multiple Vitamins-Calcium (ONE-A-DAY WOMENS FORMULA PO), Take 1 tablet by mouth daily., Disp: , Rfl:    rosuvastatin (CRESTOR) 5 MG tablet, Take 5 mg by mouth at bedtime., Disp: , Rfl:    valsartan-hydrochlorothiazide (DIOVAN-HCT) 160-12.5 MG tablet, Take 1 tablet by mouth daily., Disp: , Rfl:   Consent:      None    Disposition:   Return in about 1 year (around 07/31/2024) for Follow up AR. or sooner if needed.  If the results of the Zio patch are concerning we will arrange follow-up sooner with either myself or APP.  Her questions and concerns were addressed to her satisfaction. She voices understanding of the recommendations provided during this encounter.    Signed, Tessa Lerner, DO, [facility]  [facility]  [address] [place], [place] [address] [phone] 08/01/2023 12:48 PM

## 2023-08-02 DIAGNOSIS — M1711 Unilateral primary osteoarthritis, right knee: Secondary | ICD-10-CM | POA: Diagnosis not present

## 2023-08-08 DIAGNOSIS — R002 Palpitations: Secondary | ICD-10-CM

## 2023-08-09 DIAGNOSIS — M1711 Unilateral primary osteoarthritis, right knee: Secondary | ICD-10-CM | POA: Diagnosis not present

## 2023-08-16 DIAGNOSIS — M1711 Unilateral primary osteoarthritis, right knee: Secondary | ICD-10-CM | POA: Diagnosis not present

## 2023-10-30 ENCOUNTER — Ambulatory Visit: Payer: BC Managed Care – PPO | Admitting: Cardiology

## 2024-07-30 ENCOUNTER — Ambulatory Visit (HOSPITAL_COMMUNITY)
Admission: RE | Admit: 2024-07-30 | Discharge: 2024-07-30 | Disposition: A | Discharge status: Home / Self Care | Payer: BC Managed Care – PPO | Source: Ambulatory Visit | Attending: Cardiovascular Disease | Admitting: Cardiovascular Disease

## 2024-07-30 DIAGNOSIS — I061 Rheumatic aortic insufficiency: Secondary | ICD-10-CM | POA: Insufficient documentation

## 2024-07-30 DIAGNOSIS — Z8679 Personal history of other diseases of the circulatory system: Secondary | ICD-10-CM | POA: Insufficient documentation

## 2024-07-30 LAB — ECHOCARDIOGRAM COMPLETE
AR max vel: 1.09 cm2
AV Area VTI: 1.16 cm2
AV Area mean vel: 1.07 cm2
AV Mean grad: 17.5 mmHg
AV Peak grad: 34.9 mmHg
Ao pk vel: 2.96 m/s
Area-P 1/2: 3.28 cm2
P 1/2 time: 372 ms
S' Lateral: 2.8 cm

## 2024-08-08 ENCOUNTER — Ambulatory Visit: Payer: Self-pay | Admitting: Cardiology

## 2024-08-09 NOTE — Progress Notes (Signed)
 Attempted to call patient no answer, left a vm to return the call

## 2024-08-12 NOTE — Telephone Encounter (Signed)
 Spoke with patient. Relayed Dr. Tyree message about scheduling appointment. Patient accepted appointment for 08/13/2024 at 8 am. Patient stated that she was freaked out about the aortic stenosis and wanted to be seen ASAP.

## 2024-08-13 ENCOUNTER — Ambulatory Visit: Attending: Cardiology | Admitting: Cardiology

## 2024-08-13 ENCOUNTER — Encounter: Payer: Self-pay | Admitting: Cardiology

## 2024-08-13 VITALS — BP 124/80 | HR 78 | Resp 16 | Ht 66.0 in | Wt 221.4 lb

## 2024-08-13 DIAGNOSIS — E66812 Obesity, class 2: Secondary | ICD-10-CM

## 2024-08-13 DIAGNOSIS — Z8679 Personal history of other diseases of the circulatory system: Secondary | ICD-10-CM | POA: Diagnosis not present

## 2024-08-13 DIAGNOSIS — I060 Rheumatic aortic stenosis: Secondary | ICD-10-CM | POA: Diagnosis not present

## 2024-08-13 DIAGNOSIS — I10 Essential (primary) hypertension: Secondary | ICD-10-CM

## 2024-08-13 DIAGNOSIS — I061 Rheumatic aortic insufficiency: Secondary | ICD-10-CM | POA: Diagnosis not present

## 2024-08-13 DIAGNOSIS — E782 Mixed hyperlipidemia: Secondary | ICD-10-CM

## 2024-08-13 DIAGNOSIS — Z6835 Body mass index (BMI) 35.0-35.9, adult: Secondary | ICD-10-CM

## 2024-08-13 DIAGNOSIS — E6609 Other obesity due to excess calories: Secondary | ICD-10-CM

## 2024-08-13 NOTE — Patient Instructions (Addendum)
 Medication Instructions:  Your physician recommends that you continue on your current medications as directed. Please refer to the Current Medication list given to you today.  *If you need a refill on your cardiac medications before your next appointment, please call your pharmacy*  Lab Work: none If you have labs (blood work) drawn today and your tests are completely normal, you will receive your results only by: MyChart Message (if you have MyChart) OR A paper copy in the mail If you have any lab test that is abnormal or we need to change your treatment, we will call you to review the results.  Testing/Procedures: Your physician has requested that you have an echocardiogram in September 2026. Echocardiography is a painless test that uses sound waves to create images of your heart. It provides your doctor with information about the size and shape of your heart and how well your heart's chambers and valves are working. This procedure takes approximately one hour. There are no restrictions for this procedure. Please do NOT wear cologne, perfume, aftershave, or lotions (deodorant is allowed). Please arrive 15 minutes prior to your appointment time.  Please note: We ask at that you not bring children with you during ultrasound (echo/ vascular) testing. Due to room size and safety concerns, children are not allowed in the ultrasound rooms during exams. Our front office staff cannot provide observation of children in our lobby area while testing is being conducted. An adult accompanying a patient to their appointment will only be allowed in the ultrasound room at the discretion of the ultrasound technician under special circumstances. We apologize for any inconvenience.   Follow-Up: At [facility] you and your health needs are our priority.  As part of our continuing mission to provide you with exceptional heart care, our providers are all part of one team.  This team includes your primary  Cardiologist (physician) and Advanced Practice Providers or APPs (Physician Assistants and Nurse Practitioners) who all work together to provide you with the care you need, when you need it.  Your next appointment:   12 month(s)--October 2026  Provider:   Madonna Large, DO    We recommend signing up for the patient portal called MyChart.  Sign up information is provided on this After Visit Summary.  MyChart is used to connect with patients for Virtual Visits (Telemedicine).  Patients are able to view lab/test results, encounter notes, upcoming appointments, etc.  Non-urgent messages can be sent to your provider as well.   To learn more about what you can do with MyChart, go to [email or web address].   Other Instructions

## 2024-08-13 NOTE — Progress Notes (Signed)
 Cardiology Office Note:  .   Date:  08/13/2024  ID:  Aleck Bihari, DOB April 04, 1961, MRN [id number] PCP:  Waylan Almarie SAUNDERS, MD  [facility] Providers Cardiologist:  Madonna Large, DO , [facility] (established care 05/2021) Electrophysiologist:  None  Click to update primary MD,subspecialty MD or APP then REFRESH:1}    History of Present Illness: .   Dana Hamilton is a 63 y.o. Caucasian female whose past medical history and cardiovascular risk factors includes:  Aortic regurgitation / stenosis, hypertension, hyperlipidemia, prediabetes, insomnia, general anxiety disorder, postmenopausal female, history of rheumatic fever, history of COVID-19 infection x2, obesity due to excess calories, former smoker.   Patient is being followed by the practice for management of valvular heart disease. She presents today for a 1 year follow-up visit.  She had an echocardiogram notes preserved LVEF, normal right ventricular size and function, trivial MR/mild to moderate AR, and moderate aortic stenosis.  Clinically denies anginal chest pain symptoms. 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 She has increased her physical activity, following a Mediterranean-based diet and has lost weight since last office visit.  She denies anginal chest pain, heart failure symptoms, or near syncope/syncope.  She had multiple questions with regards to valvular heart disease which we went over today.   Review of Systems: .   Review of Systems  Constitutional: Positive for weight loss.  Cardiovascular:  Positive for palpitations. Negative for chest pain, claudication, dyspnea on exertion, irregular heartbeat, leg swelling, near-syncope, orthopnea, paroxysmal nocturnal dyspnea and syncope.  Respiratory:  Negative for shortness of breath.   Hematologic/Lymphatic: Negative for bleeding problem.  Musculoskeletal:  Negative for muscle cramps and myalgias.   Neurological:  Negative for dizziness and light-headedness.    Studies Reviewed:   EKG: The ekg ordered today was personally reviewed by me.  EKG Interpretation Date/Time:  Tuesday August 13 2024 08:02:28 EDT Ventricular Rate:  78 PR Interval:  166 QRS Duration:  116 QT Interval:  378 QTC Calculation: 430 R Axis:   43  Text Interpretation: Normal sinus rhythm Incomplete right bundle branch block When compared with ECG of 01-Aug-2023 10:30, No significant change was found Confirmed by Large Madonna [phone]) on 08/13/2024 8:16:05 AM  Echocardiogram: 07/12/2022: LVEF 60 to 65%, grade 1 diastolic dysfunction, mild to moderate AR, mild MR, see report for additional details   07/30/2024 1. Left ventricular ejection fraction, by estimation, is 55 to 60%. Left ventricular ejection fraction by 3D volume is 56 %. The left ventricle has normal function. The left ventricle has no regional wall motion abnormalities. There is moderate  asymmetric left ventricular hypertrophy of the basal-septal segment. Left ventricular diastolic parameters were normal. The average left ventricular global longitudinal strain is -22.1 %. The global longitudinal strain is normal. 2. Right ventricular systolic function is normal. The right ventricular size is normal. Tricuspid regurgitation signal is inadequate for assessing PA pressure. 3. Left atrial size was mildly dilated. 4. The mitral valve is grossly normal. Trivial mitral valve regurgitation. No evidence of mitral stenosis. 5. The aortic valve is calcified. There is moderate calcification of the aortic valve. There is moderate thickening of the aortic valve. Aortic valve regurgitation is mild to moderate. Moderate aortic valve stenosis. Aortic regurgitation PHT measures  372 msec. Aortic valve area, by VTI measures 1.16 cm. Aortic valve mean gradient measures 17.5 mmHg. Aortic valve Vmax measures 2.96 m/s. DI 0.46   Stress Testing: No results found for this  or any previous  visit from the past 1095 days.   Cardiac monitor (Zio Patch): October 2024 Dominant rhythm sinus. Heart rate 50-156 bpm.  Avg HR 73 bpm. No atrial fibrillation detected during the monitoring period. No ventricular tachycardia, high grade AV block, pauses (3 seconds or longer). Total supraventricular ectopic burden <1%. Rare auto triggered events of PSVT-fastest episode 8 beats, 3.3 seconds, max HR 156 bpm, average HR 135 bpm, occurred on 08/11/2023 at 7:05 AM. Total ventricular ectopic burden 1.7%. Patient triggered events: 11. Underlying rhythm sinus with rare PAC or PVCs. 2 patient triggered events noted underlying rhythm to be sinus with short runs of ventricular trigeminy.  Risk Assessment/Calculations:    None   Labs:       Latest Ref Rng & Units 08/04/2017    2:52 AM 08/03/2017    4:12 PM  CBC  WBC 4.0 - 10.5 K/uL 12.4  18.0   Hemoglobin 12.0 - 15.0 g/dL 87.3  85.7   Hematocrit 36.0 - 46.0 % 38.4  42.8   Platelets 150 - 400 K/uL 290  316        Latest Ref Rng & Units 08/04/2017    2:52 AM 08/03/2017    4:12 PM  BMP  Glucose 65 - 99 mg/dL 890  890   BUN 6 - 20 mg/dL 20  19   Creatinine 9.55 - 1.00 mg/dL 8.78  8.82   Sodium 864 - 145 mmol/L 137  135   Potassium 3.5 - 5.1 mmol/L 3.6  4.5   Chloride 101 - 111 mmol/L 101  100   CO2 22 - 32 mmol/L 27  26   Calcium  8.9 - 10.3 mg/dL 8.9  9.3       Latest Ref Rng & Units 08/04/2017    2:52 AM 08/03/2017    4:12 PM  CMP  Glucose 65 - 99 mg/dL 890  890   BUN 6 - 20 mg/dL 20  19   Creatinine 9.55 - 1.00 mg/dL 8.78  8.82   Sodium 864 - 145 mmol/L 137  135   Potassium 3.5 - 5.1 mmol/L 3.6  4.5   Chloride 101 - 111 mmol/L 101  100   CO2 22 - 32 mmol/L 27  26   Calcium  8.9 - 10.3 mg/dL 8.9  9.3   Total Protein 6.5 - 8.1 g/dL 6.3    Total Bilirubin 0.3 - 1.2 mg/dL 0.8    Alkaline Phos 38 - 126 U/L 58    AST 15 - 41 U/L 15    ALT 14 - 54 U/L 16      Lab Results  Component Value Date   CHOL 145  08/04/2017   HDL 48 08/04/2017   LDLCALC 89 08/04/2017   TRIG 42 08/04/2017   CHOLHDL 3.0 08/04/2017   No results for input(s): LIPOA in the last 8760 hours. No components found for: NTPROBNP No results for input(s): PROBNP in the last 8760 hours. No results for input(s): TSH in the last 8760 hours.   Physical Exam:    Today's Vitals   08/13/24 0800  BP: 124/80  Pulse: 78  Resp: 16  SpO2: 98%  Weight: 221 lb 6.4 oz (100.4 kg)  Height: 5' 6 (1.676 m)   Body mass index is 35.73 kg/m. Wt Readings from Last 3 Encounters:  08/13/24 221 lb 6.4 oz (100.4 kg)  08/01/23 228 lb (103.4 kg)  08/25/22 224 lb (101.6 kg)    Physical Exam  Constitutional: No distress.  Age appropriate, hemodynamically stable.  Neck: No JVD present.  Cardiovascular: Normal rate, regular rhythm, S1 normal, S2 normal, intact distal pulses and normal pulses. Exam reveals no gallop, no S3 and no S4.  Murmur heard. Midsystolic murmur is present with a grade of 3/6 at the upper right sternal border radiating to the neck. Blowing decrescendo early diastolic murmur is present with a grade of 3/4 at the upper right sternal border radiating to the apex. Pulses:      Carotid pulses are  on the right side with bruit and  on the left side with bruit.      Dorsalis pedis pulses are 2+ on the right side and 2+ on the left side.       Posterior tibial pulses are 2+ on the right side and 2+ on the left side.  Pulmonary/Chest: Effort normal and breath sounds normal. No stridor. She has no wheezes. She has no rales.  Abdominal: Soft. Bowel sounds are normal. She exhibits no distension. There is no abdominal tenderness.  Musculoskeletal:        General: No edema.     Cervical back: Neck supple.  Neurological: She is alert and oriented to person, place, and time. She has intact cranial nerves (2-12).  Skin: Skin is warm and moist.     Impression & Recommendation(s):  Impression:   ICD-10-CM   1. Rheumatic  aortic valve insufficiency  I06.1 EKG 12-Lead    ECHOCARDIOGRAM COMPLETE    2. 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 History of rheumatic fever  Z86.79 ECHOCARDIOGRAM COMPLETE    4. Benign hypertension  I10     5. Mixed hyperlipidemia  E78.2     6. 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 Clinically asymptomatic. Re emphasized importance of blood pressure management as well as lipids Will repeat an echocardiogram in September 2026 to evaluate the progression of aortic valve disease. Patient had multiple questions with regards to valvular heart disease which we discussed in detail. Patient is advised is be cognizant of symptoms of anginal chest pain, heart failure, near-syncope or syncopal events; low likelihood at the current severity of AS.  Benign hypertension Blood pressures are very well-controlled. Continue Diovan/hydrochlorothiazide 160/12.5 mg p.o. daily  Mixed hyperlipidemia Continue Crestor 5 mg p.o. nightly. LDL 63 mg/dL August 2025, labs from [facility] [employer] reviewed independently as part of today's visit  Class 2 obesity due to excess calories without serious comorbidity with body mass index (BMI) of 35.0 to 35.9 in adult Patient congratulated with regards to her weight loss as she continues through her weight loss journey Body mass index is 35.73 kg/m. I reviewed with her importance of diet, regular physical activity/exercise, weight loss.   Patient is educated on the importance of increasing physical activity gradually as tolerated with a goal of moderate intensity exercise for 30  minutes a day 5 days a week.    Orders Placed:  Orders Placed This Encounter  Procedures   EKG 12-Lead   ECHOCARDIOGRAM COMPLETE    Standing Status:   Future    Expected Date:   07/23/2025    Expiration Date:   08/13/2025  Where [facility] this test [facility] performed[facility]    Does the patient weigh less than or greater than 250 lbs?:   Patient weighs less than 250 lbs    Perflutren DEFINITY (image enhancing agent) should be administered unless hypersensitivity or allergy exist:   Administer Perflutren    Reason for exam-Echo:   Aortic valve disorder I35.9    Reason for exam-Echo:   Aortic stenosis I35.0    Final Medication List:   No orders of the defined types were placed in this encounter.   There are no discontinued medications.    Current Outpatient Medications:    ALPRAZolam (XANAX) 0.25 MG tablet, Take 0.25 mg by mouth at bedtime as needed., Disp: , Rfl:    buPROPion  (WELLBUTRIN  XL) 300 MG 24 hr tablet, Take 300 mg by mouth daily., Disp: , Rfl:    Cholecalciferol (VITAMIN D3) 2000 units capsule, Take 2,000 Units by mouth daily., Disp: , Rfl:    Multiple Vitamins-Calcium  (ONE-A-DAY WOMENS FORMULA PO), Take 1 tablet by mouth daily., Disp: , Rfl:    rOPINIRole (REQUIP) 0.5 MG tablet, Take 0.5 mg by mouth in the morning and at bedtime., Disp: , Rfl:    rosuvastatin (CRESTOR) 5 MG tablet, Take 5 mg by mouth at bedtime., Disp: , Rfl:    sertraline (ZOLOFT) 100 MG tablet, Take 150 mg by mouth every morning., Disp: , Rfl:    valsartan-hydrochlorothiazide (DIOVAN-HCT) 160-12.5 MG tablet, Take 1 tablet by mouth daily., Disp: , Rfl:   Consent:      None    Disposition:   Return in about 1 year (around 08/13/2025). or sooner if needed.  Her questions and concerns were addressed to her satisfaction. She voices understanding of the recommendations provided during this encounter.    Signed, Madonna Michele HAS, [facility]  A Division of [facility] [address] [place], [place] [address]   08/13/2024 11:20 AM

## 2025-07-02 ENCOUNTER — Ambulatory Visit (HOSPITAL_COMMUNITY)
